# Patient Record
Sex: Female | Born: 1990 | Hispanic: Yes | Smoking: Never smoker
Health system: Southern US, Community
[De-identification: ages and names within clinical notes are randomized; demographics above are authoritative.]

## PROBLEM LIST (undated history)

## (undated) ENCOUNTER — Inpatient Hospital Stay (HOSPITAL_COMMUNITY): Payer: Self-pay

## (undated) DIAGNOSIS — E8881 Metabolic syndrome: Secondary | ICD-10-CM

## (undated) DIAGNOSIS — Z8679 Personal history of other diseases of the circulatory system: Secondary | ICD-10-CM

## (undated) DIAGNOSIS — E88819 Insulin resistance, unspecified: Secondary | ICD-10-CM

## (undated) DIAGNOSIS — Z8709 Personal history of other diseases of the respiratory system: Secondary | ICD-10-CM

## (undated) DIAGNOSIS — D62 Acute posthemorrhagic anemia: Secondary | ICD-10-CM

## (undated) HISTORY — PX: NO PAST SURGERIES: SHX2092

---

## 2012-08-22 ENCOUNTER — Encounter (HOSPITAL_COMMUNITY): Payer: Self-pay | Admitting: *Deleted

## 2012-08-22 ENCOUNTER — Emergency Department (HOSPITAL_COMMUNITY)
Admission: EM | Admit: 2012-08-22 | Discharge: 2012-08-23 | Disposition: A | Discharge status: Home / Self Care | Payer: No Typology Code available for payment source | Attending: Emergency Medicine | Admitting: Emergency Medicine

## 2012-08-22 DIAGNOSIS — O200 Threatened abortion: Secondary | ICD-10-CM

## 2012-08-22 DIAGNOSIS — Z79899 Other long term (current) drug therapy: Secondary | ICD-10-CM | POA: Insufficient documentation

## 2012-08-22 DIAGNOSIS — O9989 Other specified diseases and conditions complicating pregnancy, childbirth and the puerperium: Secondary | ICD-10-CM | POA: Insufficient documentation

## 2012-08-22 DIAGNOSIS — R110 Nausea: Secondary | ICD-10-CM | POA: Insufficient documentation

## 2012-08-22 DIAGNOSIS — E860 Dehydration: Secondary | ICD-10-CM

## 2012-08-22 HISTORY — DX: Metabolic syndrome: E88.81

## 2012-08-22 HISTORY — DX: Insulin resistance, unspecified: E88.819

## 2012-08-22 LAB — CBC
HCT: 37.9 % (ref 36.0–46.0)
RDW: 13.5 % (ref 11.5–15.5)
WBC: 10.1 10*3/uL (ref 4.0–10.5)

## 2012-08-22 LAB — TYPE AND SCREEN
ABO/RH(D): O POS
Antibody Screen: NEGATIVE

## 2012-08-22 LAB — URINALYSIS, ROUTINE W REFLEX MICROSCOPIC
Glucose, UA: NEGATIVE mg/dL
Specific Gravity, Urine: 1.024 (ref 1.005–1.030)
Urobilinogen, UA: 0.2 mg/dL (ref 0.0–1.0)

## 2012-08-22 LAB — URINE MICROSCOPIC-ADD ON

## 2012-08-22 LAB — HCG, QUANTITATIVE, PREGNANCY: hCG, Beta Chain, Quant, S: 52710 m[IU]/mL — ABNORMAL HIGH (ref <5)

## 2012-08-22 MED ORDER — SODIUM CHLORIDE 0.9 % IV BOLUS (SEPSIS): 1000.0000 mL | Freq: Once | INTRAVENOUS | Status: DC

## 2012-08-22 NOTE — ED Notes (Signed)
Pt reports being [redacted] weeks pregnant-said that her OBGYN was 2 weeks ago, states that everything looked good.  Pt reports that she has had some abdominal cramping and slight bleeding today.  States that she has not had to use a pad or tampon, reports that she only notices blood after urinating.  No distress noted.

## 2012-08-23 LAB — OB RESULTS CONSOLE ABO/RH: RH Type: POSITIVE

## 2012-08-23 NOTE — ED Provider Notes (Signed)
History     CSN: [id number]  Arrival date & time 08/22/12  2109   First MD Initiated Contact with Patient 08/22/12 2337      Chief Complaint  Patient presents with  . Vaginal Bleeding  . Possible Pregnancy    (Consider location/radiation/quality/duration/timing/severity/associated sxs/prior treatment) HPI  22 yo G1 P0 female with known IUP, LMP 07/18/12, presents with spotty vaginal bleeding x approx 4 hrs. Denies abdominal pain, unusual vaginal discharge and dysuria. No previous VB during this pregnancy. Patient notes that she has been nauseated but, denies vomiting. Feels her po intake may be mildly diminished secondary to nausea.   Pt has not passed clots or tissue.    Past Medical History  Diagnosis Date  . Insulin resistance     History reviewed. No pertinent past surgical history.  History reviewed. No pertinent family history.  History  Substance Use Topics  . Smoking status: Never Smoker   . Smokeless tobacco: Not on file  . Alcohol Use: No    OB History   Grav Para Term Preterm Abortions TAB SAB Ect Mult Living   1               Review of Systems  Gen: no weight loss, fevers, chills, night sweats Eyes: no discharge or drainage, no occular pain or visual changes Nose: no epistaxis or rhinorrhea Mouth: no dental pain, no sore throat Neck: no neck pain Lungs: no SOB, cough, wheezing CV: no chest pain, palpitations, dependent edema or orthopnea Abd: no abdominal pain, nausea, vomiting GU: As per history of present illness, otherwise MSK: no myalgias or arthralgias Neuro: no headache, no focal neurologic deficits Skin: no rash Psyche: negative.  Allergies  Ibuprofen  Home Medications   Current Outpatient Rx  Name  Route  Sig  Dispense  Refill  . metFORMIN (GLUCOPHAGE) 850 MG tablet   Oral   Take 850 mg by mouth 2 (two) times daily with a meal.         . Prenatal Vit-Fe Fumarate-FA (MULTIVITAMIN-PRENATAL) 27-0.8 MG TABS   Oral   Take 1  tablet by mouth daily at 12 noon.           BP 134/85  Pulse 85  Temp(Src) 98.7 F (37.1 C) (Oral)  Resp 18  SpO2 100%  Physical Exam Gen: well developed and well nourished appearing Head: NCAT Eyes: PERL, EOMI Nose: no epistaixis or rhinorrhea Mouth/throat: mucosa is moist and pink Neck: supple, no stridor Lungs: CTA B, no wheezing, rhonchi or rales Heart-regular rate and rhythm, no murmur, extremities well perfused Abd: soft, notender, nondistended Back: no ttp, no cva ttp Skin: no rashese, wnl Neuro: CN ii-xii grossly intact, no focal deficits Psyche; normal affect,  calm and cooperative.   ED Course  Procedures (including critical care time)  Results for orders placed during the hospital encounter of 08/22/12 (from the past 24 hour(s))  URINALYSIS, ROUTINE W REFLEX MICROSCOPIC     Status: Abnormal   Collection Time    08/22/12  9:39 PM      Result Value Range   Color, Urine YELLOW  YELLOW   APPearance CLEAR  CLEAR   Specific Gravity, Urine 1.024  1.005 - 1.030   pH 6.0  5.0 - 8.0   Glucose, UA NEGATIVE  NEGATIVE mg/dL   Hgb urine dipstick SMALL (*) NEGATIVE   Bilirubin Urine NEGATIVE  NEGATIVE   Ketones, ur >80 (*) NEGATIVE mg/dL   Protein, ur NEGATIVE  NEGATIVE mg/dL  Urobilinogen, UA 0.2  0.0 - 1.0 mg/dL   Nitrite NEGATIVE  NEGATIVE   Leukocytes, UA NEGATIVE  NEGATIVE  URINE MICROSCOPIC-ADD ON     Status: Abnormal   Collection Time    08/22/12  9:39 PM      Result Value Range   Squamous Epithelial / LPF FEW (*) RARE   WBC, UA 3-6  <3 WBC/hpf   RBC / HPF 3-6  <3 RBC/hpf   Bacteria, UA RARE  RARE   Urine-Other MUCOUS PRESENT    POCT PREGNANCY, URINE     Status: Abnormal   Collection Time    08/22/12  9:42 PM      Result Value Range   Preg Test, Ur POSITIVE (*) NEGATIVE  HCG, QUANTITATIVE, PREGNANCY     Status: Abnormal   Collection Time    08/22/12  9:49 PM      Result Value Range   hCG, Beta Chain, Quant, S 52710 (*) <5 mIU/mL  CBC     Status:  None   Collection Time    08/22/12  9:50 PM      Result Value Range   WBC 10.1  4.0 - 10.5 K/uL   RBC 4.45  3.87 - 5.11 MIL/uL   Hemoglobin 13.4  12.0 - 15.0 g/dL   HCT 16.1  09.6 - 04.5 %   MCV 85.2  78.0 - 100.0 fL   MCH 30.1  26.0 - 34.0 pg   MCHC 35.4  30.0 - 36.0 g/dL   RDW 40.9  81.1 - 91.4 %   Platelets 276  150 - 400 K/uL  ABO/RH     Status: None   Collection Time    08/22/12  9:55 PM      Result Value Range   ABO/RH(D) O POS    TYPE AND SCREEN     Status: None   Collection Time    08/22/12  9:55 PM      Result Value Range   ABO/RH(D) O POS     Antibody Screen NEG     Sample Expiration 08/25/2012     Bedside U/S shows singleton IUP with visible heart beat.    MDM  Patient with known IUP here with sx consistent with threatened abortion. Bedside U/S shows fetal cardiac activity. I have relayed this information to the patient and explained diagnosis of threatened miscarriage along with indication for pelvic rest. Pt is noted to have some mild ketonuria.  Will tx with IVF. Patient to f/u with obstetrician.         Brandt Loosen, MD 08/23/12 [phone]

## 2012-08-29 LAB — OB RESULTS CONSOLE RUBELLA ANTIBODY, IGM: RUBELLA: IMMUNE

## 2012-08-29 LAB — OB RESULTS CONSOLE HIV ANTIBODY (ROUTINE TESTING): HIV: NONREACTIVE

## 2012-08-29 LAB — OB RESULTS CONSOLE HEPATITIS B SURFACE ANTIGEN: HEP B S AG: NEGATIVE

## 2012-08-29 LAB — OB RESULTS CONSOLE RPR: RPR: NONREACTIVE

## 2012-09-11 LAB — OB RESULTS CONSOLE GC/CHLAMYDIA
Chlamydia: NEGATIVE
Gonorrhea: NEGATIVE

## 2013-01-18 ENCOUNTER — Encounter (HOSPITAL_COMMUNITY): Payer: Self-pay | Admitting: *Deleted

## 2013-01-18 ENCOUNTER — Inpatient Hospital Stay (HOSPITAL_COMMUNITY)
Admission: AD | Admit: 2013-01-18 | Discharge: 2013-01-18 | Disposition: A | Discharge status: Home / Self Care | Payer: BC Managed Care – PPO | Source: Ambulatory Visit | Attending: Obstetrics | Admitting: Obstetrics

## 2013-01-18 DIAGNOSIS — R109 Unspecified abdominal pain: Secondary | ICD-10-CM | POA: Insufficient documentation

## 2013-01-18 DIAGNOSIS — O99891 Other specified diseases and conditions complicating pregnancy: Secondary | ICD-10-CM | POA: Insufficient documentation

## 2013-01-18 LAB — URINALYSIS, ROUTINE W REFLEX MICROSCOPIC
Bilirubin Urine: NEGATIVE
Leukocytes, UA: NEGATIVE
Nitrite: NEGATIVE
Specific Gravity, Urine: <1.005 — ABNORMAL LOW (ref 1.005–1.030)
pH: 6 (ref 5.0–8.0)

## 2013-01-18 NOTE — H&P (Signed)
CC: abdominal/ back pain  HPI: 22 yo G1 at 28'1 presents w/ 4 hrs of back./ abdominal pain. Patient notes pain or awoke her from sleep and, as and goes. Good fetal movement. No leakage of fluid. No vaginal bleeding. Patient notes no fevers, no nausea or vomiting. Patient did have a bowel movement this morning. Patient reports no constipation. Patient reports no new activities or musculoskeletal trauma.  Past medical history: Polycystic ovarian syndrome, anxiety   PE: Filed Vitals:   01/18/13 0721 01/18/13 0724  BP: 126/76   Pulse: 92   Temp: 97.8 F (36.6 C)   TempSrc: Oral   Resp: 18   Height:  5\' 4"  (1.626 m)  Weight:  77.111 kg (170 lb)   general: alert, no distress Cardiovascular: Regular rate and rhythm no murmurs rubs Pulmonary: Clear to auscultation bilaterally Back: No costovertebral angle tenderness, no apparent musculoskeletal spasm Abdomen: No right upper quadrant pain, fundus nontender, abdomen nontender nondistended, no rebound, no guarding, gravid Lower extremity: No edema, nontender GU: Normal external genitalia, normal vagina, cervix long closed, no bladder tenderness, no lower uterine tenderness, no adnexal masses or tenderness, no abnormal discharge, no evidence rupture of membranes, no bleeding  Toco: No contractions Fetal heart beat: 145, positive accelerations, no decelerations, 10 beat variability  Wet prep: No sperm, no trichomonas, no white blood cells, normal lactobacilli, normal epithelial cells, no clue cells  Assessment and plan: 22 year old G1 at 28 weeks and 1 day presents with nonspecific abdominal back pain with negative evaluation. No evidence of preterm labor as no contractions and cervix is closed. FFN pending No evidence of the urinary tract infection by a negative urinalysis, will send urine culture Likely back pain in pregnancy-  will recommend massage, heat, Tylenol as needed, exercise, avoidance of constipation. Patient to return if pain  worsens or she develops fevers. Continue daily kick counts. Fetal testing reactive and reassuring  Larrie Lucia A. 01/18/2013 9:11 AM

## 2013-01-18 NOTE — MAU Note (Signed)
Pt states she was woken up at 0400 this morning with this pain.  States it feels like menstrual cramps.  Says her back is killing her.  Denies bleeding or abnormal vaginal discharge.

## 2013-01-19 LAB — URINE CULTURE: Culture: NO GROWTH

## 2013-03-02 ENCOUNTER — Inpatient Hospital Stay (HOSPITAL_COMMUNITY)
Admission: AD | Admit: 2013-03-02 | Discharge: 2013-03-02 | Disposition: A | Discharge status: Home / Self Care | Payer: BC Managed Care – PPO | Source: Ambulatory Visit | Attending: Obstetrics and Gynecology | Admitting: Obstetrics and Gynecology

## 2013-03-02 ENCOUNTER — Inpatient Hospital Stay (HOSPITAL_COMMUNITY): Payer: BC Managed Care – PPO

## 2013-03-02 ENCOUNTER — Encounter (HOSPITAL_COMMUNITY): Payer: Self-pay

## 2013-03-02 DIAGNOSIS — O4703 False labor before 37 completed weeks of gestation, third trimester: Secondary | ICD-10-CM

## 2013-03-02 DIAGNOSIS — M545 Low back pain, unspecified: Secondary | ICD-10-CM | POA: Insufficient documentation

## 2013-03-02 DIAGNOSIS — R109 Unspecified abdominal pain: Secondary | ICD-10-CM | POA: Insufficient documentation

## 2013-03-02 DIAGNOSIS — O26859 Spotting complicating pregnancy, unspecified trimester: Secondary | ICD-10-CM | POA: Insufficient documentation

## 2013-03-02 LAB — URINALYSIS, ROUTINE W REFLEX MICROSCOPIC
Bilirubin Urine: NEGATIVE
Glucose, UA: NEGATIVE mg/dL
Hgb urine dipstick: NEGATIVE
Ketones, ur: NEGATIVE mg/dL
pH: 6 (ref 5.0–8.0)

## 2013-03-02 LAB — URINE MICROSCOPIC-ADD ON

## 2013-03-02 LAB — WET PREP, GENITAL: Clue Cells Wet Prep HPF POC: NONE SEEN

## 2013-03-02 MED ORDER — NIFEDIPINE 10 MG PO CAPS: 10.0000 mg | ORAL_CAPSULE | Freq: Three times a day (TID) | ORAL | Status: DC

## 2013-03-02 NOTE — MAU Note (Signed)
Pt states here for spotting that was bright red this am, noted when wiping, less noted last time she checked. Was having back pain and a pulling sensation intermittently. Pain was worse last night.

## 2013-03-02 NOTE — MAU Provider Note (Signed)
  History     CSN: [id number]  Arrival date and time: 03/02/13 1144 Provider on unit: 1200 Provider notified: 1220 Provider at bedside: 1230     Chief Complaint  Patient presents with  . Abdominal Cramping   HPI  Ms. Brandy Bradley is a 21 yo G1P0 at 34.[redacted]wks gestation by ultrasound.  She presents today with complaints of spotting with wiping x 1 episode this AM, abdominal pressure and low back pain since last night. She reports being at risk for PTL, on bedrest and taking Prometrium.  She also complains of a mucousy vaginal   discharge with the spotting since this AM.    Past Medical History  Diagnosis Date  . Insulin resistance     History reviewed. No pertinent past surgical history.  History reviewed. No pertinent family history.  History  Substance Use Topics  . Smoking status: Never Smoker   . Smokeless tobacco: Not on file  . Alcohol Use: No    Allergies:  Allergies  Allergen Reactions  . Ibuprofen Other (See Comments)    Eye Swelling     Prescriptions prior to admission  Medication Sig Dispense Refill  . polyethylene glycol (MIRALAX / GLYCOLAX) packet Take 17 g by mouth daily as needed for mild constipation.      . Prenatal Vit-Fe Fumarate-FA (PRENATAL MULTIVITAMIN) TABS tablet Take 1 tablet by mouth daily at 12 noon.      . Ranitidine HCl (ZANTAC PO) Take 1 tablet by mouth 2 (two) times daily.        Review of Systems  Constitutional: Negative.   HENT: Negative.   Eyes: Negative.   Respiratory: Negative.   Cardiovascular: Negative.   Gastrointestinal: Positive for abdominal pain.       Pelvic cramping  Genitourinary:       Vaginal spotting  Musculoskeletal: Negative.   Skin: Negative.   Neurological: Negative.   Endo/Heme/Allergies: Negative.   Psychiatric/Behavioral: Negative.     Physical Exam   Blood pressure 141/78, pulse 101, temperature 98.2 F (36.8 C), temperature source Oral, resp. rate 18, height 5\' 4"  (1.626 m),  weight 80.4 kg (177 lb 4 oz), last menstrual period 07/05/2012, SpO2 100.00%.  Physical Exam  Constitutional: She is oriented to person, place, and time. She appears well-developed and well-nourished.  HENT:  Head: Normocephalic and atraumatic.  Eyes: Pupils are equal, round, and reactive to light.  Neck: Normal range of motion. Neck supple.  Cardiovascular: Normal rate, regular rhythm and normal heart sounds.   Respiratory: Effort normal and breath sounds normal.  GI: Soft. Bowel sounds are normal.  Genitourinary: Vagina normal and uterus normal.  gravid  Musculoskeletal: Normal range of motion.  Neurological: She is alert and oriented to person, place, and time.  Skin: Skin is warm and dry.  Psychiatric: She has a normal mood and affect. Her behavior is normal. Judgment and thought content normal.  VE: 1cm/50%/high/firm  MAU Course  Procedures CCUA Wet Prep fFN EFM OB limited ultrasound Assessment and Plan  IUP at 34.[redacted]wks gestation Spotting Back Pain  Discharge home  Preterm Labor Precautions Procardia 10 mg po TID x 48 hrs Keep scheduled appointment on 03/07/13 Call if symptoms worsen  *Dr. Juliene Pina notified of plan - agrees - recommends Procardia 10mg  TID x 48hrs  Kenard Gower, MSN, CNM 03/02/2013, 12:38 PM

## 2013-03-03 LAB — URINE CULTURE: Colony Count: NO GROWTH

## 2013-03-03 NOTE — MAU Provider Note (Signed)
Reviewed and agree with note and plan. V.Mashanda Ishibashi, MD  

## 2013-03-06 NOTE — L&D Delivery Note (Signed)
Delivery Note At 9:50 AM a viable and healthy female was delivered via Vaginal, Spontaneous Delivery (Presentation:OA ).  APGAR: 9 , 9; weight - pending Placenta status: complete, spontaneous .  Cord: 3 vessels with the following complications: None.  Cord pH: Not indicated  Anesthesia: Epidural Local 1% Lidocaine Episiotomy: None Lacerations: Vaginal; to right Labial Suture Repair: 3.0 Vicryl rapide Est. Blood Loss (mL): 400 cc  Mom to postpartum.  Baby to Couplet care / Skin to Skin.  Jenne Sellinger R 04/01/2013, 10:24 AM

## 2013-03-07 LAB — OB RESULTS CONSOLE GBS: STREP GROUP B AG: NEGATIVE

## 2013-03-29 ENCOUNTER — Encounter (HOSPITAL_COMMUNITY): Payer: Self-pay | Admitting: *Deleted

## 2013-03-29 ENCOUNTER — Inpatient Hospital Stay (HOSPITAL_COMMUNITY)
Admission: AD | Admit: 2013-03-29 | Discharge: 2013-03-29 | Disposition: A | Discharge status: Home / Self Care | Payer: BC Managed Care – PPO | Source: Ambulatory Visit | Attending: Obstetrics | Admitting: Obstetrics

## 2013-03-29 DIAGNOSIS — O479 False labor, unspecified: Secondary | ICD-10-CM | POA: Insufficient documentation

## 2013-03-29 NOTE — Progress Notes (Signed)
Wiliam Ke Bailey CNM notified of pt's complaints of contractions, and LOF, VE results and orders received

## 2013-03-29 NOTE — MAU Note (Signed)
Pt presents with complaints of contractions that started early this morning but have gotten worse since 9am. States some leakage of fluid this morning, denies any bleeding

## 2013-03-29 NOTE — Progress Notes (Signed)
Notified pt here for labor eval. No cervical exam performed yet, RN to check pt then call back with results.

## 2013-03-29 NOTE — Discharge Instructions (Signed)

## 2013-04-01 ENCOUNTER — Inpatient Hospital Stay (HOSPITAL_COMMUNITY)
Admission: AD | Admit: 2013-04-01 | Discharge: 2013-04-03 | DRG: 775 | Disposition: A | Discharge status: Home / Self Care | Payer: No Typology Code available for payment source | Source: Ambulatory Visit | Attending: Obstetrics & Gynecology | Admitting: Obstetrics & Gynecology

## 2013-04-01 ENCOUNTER — Inpatient Hospital Stay (HOSPITAL_COMMUNITY): Payer: No Typology Code available for payment source | Admitting: Anesthesiology

## 2013-04-01 ENCOUNTER — Encounter (HOSPITAL_COMMUNITY): Payer: No Typology Code available for payment source | Admitting: Anesthesiology

## 2013-04-01 ENCOUNTER — Encounter (HOSPITAL_COMMUNITY): Payer: Self-pay | Admitting: *Deleted

## 2013-04-01 DIAGNOSIS — D62 Acute posthemorrhagic anemia: Secondary | ICD-10-CM

## 2013-04-01 DIAGNOSIS — O99814 Abnormal glucose complicating childbirth: Secondary | ICD-10-CM | POA: Diagnosis present

## 2013-04-01 DIAGNOSIS — IMO0001 Reserved for inherently not codable concepts without codable children: Secondary | ICD-10-CM

## 2013-04-01 DIAGNOSIS — E282 Polycystic ovarian syndrome: Secondary | ICD-10-CM | POA: Diagnosis present

## 2013-04-01 DIAGNOSIS — O26879 Cervical shortening, unspecified trimester: Secondary | ICD-10-CM | POA: Diagnosis present

## 2013-04-01 DIAGNOSIS — Z794 Long term (current) use of insulin: Secondary | ICD-10-CM

## 2013-04-01 DIAGNOSIS — O34599 Maternal care for other abnormalities of gravid uterus, unspecified trimester: Secondary | ICD-10-CM | POA: Diagnosis present

## 2013-04-01 DIAGNOSIS — O9903 Anemia complicating the puerperium: Secondary | ICD-10-CM | POA: Diagnosis not present

## 2013-04-01 HISTORY — DX: Acute posthemorrhagic anemia: D62

## 2013-04-01 LAB — CBC
HEMATOCRIT: 34.8 % — AB (ref 36.0–46.0)
HEMOGLOBIN: 11.3 g/dL — AB (ref 12.0–15.0)
MCH: 25.1 pg — AB (ref 26.0–34.0)
MCHC: 32.5 g/dL (ref 30.0–36.0)
MCV: 77.3 fL — ABNORMAL LOW (ref 78.0–100.0)
Platelets: 207 10*3/uL (ref 150–400)
RBC: 4.5 MIL/uL (ref 3.87–5.11)
RDW: 15.1 % (ref 11.5–15.5)
WBC: 8.2 10*3/uL (ref 4.0–10.5)

## 2013-04-01 LAB — ABO/RH: ABO/RH(D): O POS

## 2013-04-01 LAB — TYPE AND SCREEN
ABO/RH(D): O POS
ANTIBODY SCREEN: NEGATIVE

## 2013-04-01 LAB — RPR: RPR Ser Ql: NONREACTIVE

## 2013-04-01 MED ORDER — PANTOPRAZOLE SODIUM 40 MG PO TBEC
40.0000 mg | DELAYED_RELEASE_TABLET | Freq: Every day | ORAL | Status: DC
  Administered 2013-04-01 – 2013-04-02 (×2): 40 mg via ORAL
  Filled 2013-04-01 (×2): qty 1

## 2013-04-01 MED ORDER — LACTATED RINGERS IV SOLN
500.0000 mL | INTRAVENOUS | Status: DC | PRN
Start: 2013-04-01 — End: 2013-04-01

## 2013-04-01 MED ORDER — IBUPROFEN 600 MG PO TABS: 600.0000 mg | ORAL_TABLET | Freq: Four times a day (QID) | ORAL | Status: DC | PRN

## 2013-04-01 MED ORDER — OXYCODONE-ACETAMINOPHEN 5-325 MG PO TABS: 1.0000 | ORAL_TABLET | ORAL | Status: DC | PRN

## 2013-04-01 MED ORDER — SENNOSIDES-DOCUSATE SODIUM 8.6-50 MG PO TABS
2.0000 | ORAL_TABLET | ORAL | Status: DC
  Filled 2013-04-01 (×2): qty 2

## 2013-04-01 MED ORDER — TETANUS-DIPHTH-ACELL PERTUSSIS 5-2.5-18.5 LF-MCG/0.5 IM SUSP: 0.5000 mL | Freq: Once | INTRAMUSCULAR | Status: DC

## 2013-04-01 MED ORDER — ZOLPIDEM TARTRATE 5 MG PO TABS: 5.0000 mg | ORAL_TABLET | Freq: Every evening | ORAL | Status: DC | PRN

## 2013-04-01 MED ORDER — EPHEDRINE 5 MG/ML INJ
10.0000 mg | INTRAVENOUS | Status: DC | PRN
  Filled 2013-04-01: qty 4

## 2013-04-01 MED ORDER — EPHEDRINE 5 MG/ML INJ: 10.0000 mg | INTRAVENOUS | Status: DC | PRN

## 2013-04-01 MED ORDER — OXYTOCIN 40 UNITS IN LACTATED RINGERS INFUSION - SIMPLE MED
62.5000 mL/h | INTRAVENOUS | Status: DC
  Administered 2013-04-01: 62.5 mL/h via INTRAVENOUS
  Filled 2013-04-01: qty 1000

## 2013-04-01 MED ORDER — LIDOCAINE HCL (PF) 1 % IJ SOLN
30.0000 mL | INTRAMUSCULAR | Status: AC | PRN
  Administered 2013-04-01: 30 mL via SUBCUTANEOUS
  Filled 2013-04-01 (×2): qty 30

## 2013-04-01 MED ORDER — DIPHENHYDRAMINE HCL 50 MG/ML IJ SOLN
12.5000 mg | INTRAMUSCULAR | Status: DC | PRN
  Administered 2013-04-01 (×2): 12.5 mg via INTRAVENOUS
  Filled 2013-04-01 (×2): qty 1

## 2013-04-01 MED ORDER — BUTORPHANOL TARTRATE 1 MG/ML IJ SOLN
1.0000 mg | INTRAMUSCULAR | Status: DC
  Administered 2013-04-01: 1 mg via INTRAVENOUS
  Filled 2013-04-01: qty 1

## 2013-04-01 MED ORDER — ONDANSETRON HCL 4 MG PO TABS
4.0000 mg | ORAL_TABLET | ORAL | Status: DC | PRN
Start: 2013-04-01 — End: 2013-04-03

## 2013-04-01 MED ORDER — PHENYLEPHRINE 40 MCG/ML (10ML) SYRINGE FOR IV PUSH (FOR BLOOD PRESSURE SUPPORT)
80.0000 ug | PREFILLED_SYRINGE | INTRAVENOUS | Status: DC | PRN
  Filled 2013-04-01: qty 10

## 2013-04-01 MED ORDER — FLEET ENEMA 7-19 GM/118ML RE ENEM: 1.0000 | ENEMA | RECTAL | Status: DC | PRN

## 2013-04-01 MED ORDER — ACETAMINOPHEN 325 MG PO TABS: 650.0000 mg | ORAL_TABLET | ORAL | Status: DC | PRN

## 2013-04-01 MED ORDER — OXYCODONE-ACETAMINOPHEN 5-325 MG PO TABS
1.0000 | ORAL_TABLET | ORAL | Status: DC | PRN
  Administered 2013-04-01: 1 via ORAL
  Administered 2013-04-01 – 2013-04-02 (×2): 2 via ORAL
  Administered 2013-04-02: 1 via ORAL
  Administered 2013-04-02: 2 via ORAL
  Filled 2013-04-01: qty 2
  Filled 2013-04-01: qty 1
  Filled 2013-04-01 (×2): qty 2
  Filled 2013-04-01: qty 1

## 2013-04-01 MED ORDER — OXYTOCIN BOLUS FROM INFUSION: 500.0000 mL | INTRAVENOUS | Status: DC

## 2013-04-01 MED ORDER — FENTANYL 2.5 MCG/ML BUPIVACAINE 1/10 % EPIDURAL INFUSION (WH - ANES)
14.0000 mL/h | INTRAMUSCULAR | Status: DC | PRN
  Filled 2013-04-01: qty 125

## 2013-04-01 MED ORDER — CITRIC ACID-SODIUM CITRATE 334-500 MG/5ML PO SOLN: 30.0000 mL | ORAL | Status: DC | PRN

## 2013-04-01 MED ORDER — FENTANYL 2.5 MCG/ML BUPIVACAINE 1/10 % EPIDURAL INFUSION (WH - ANES)
INTRAMUSCULAR | Status: DC | PRN
  Administered 2013-04-01: 14 mL/h via EPIDURAL

## 2013-04-01 MED ORDER — WITCH HAZEL-GLYCERIN EX PADS: 1.0000 "application " | MEDICATED_PAD | CUTANEOUS | Status: DC | PRN

## 2013-04-01 MED ORDER — LACTATED RINGERS IV SOLN
INTRAVENOUS | Status: DC
  Administered 2013-04-01: 03:00:00 via INTRAVENOUS

## 2013-04-01 MED ORDER — DIBUCAINE 1 % RE OINT
1.0000 "application " | TOPICAL_OINTMENT | RECTAL | Status: DC | PRN
  Filled 2013-04-01: qty 28

## 2013-04-01 MED ORDER — ONDANSETRON HCL 4 MG/2ML IJ SOLN: 4.0000 mg | INTRAMUSCULAR | Status: DC | PRN

## 2013-04-01 MED ORDER — SIMETHICONE 80 MG PO CHEW: 80.0000 mg | CHEWABLE_TABLET | ORAL | Status: DC | PRN

## 2013-04-01 MED ORDER — LANOLIN HYDROUS EX OINT: TOPICAL_OINTMENT | CUTANEOUS | Status: DC | PRN

## 2013-04-01 MED ORDER — PHENYLEPHRINE 40 MCG/ML (10ML) SYRINGE FOR IV PUSH (FOR BLOOD PRESSURE SUPPORT): 80.0000 ug | PREFILLED_SYRINGE | INTRAVENOUS | Status: DC | PRN

## 2013-04-01 MED ORDER — ONDANSETRON HCL 4 MG/2ML IJ SOLN: 4.0000 mg | Freq: Four times a day (QID) | INTRAMUSCULAR | Status: DC | PRN

## 2013-04-01 MED ORDER — LACTATED RINGERS IV SOLN
500.0000 mL | Freq: Once | INTRAVENOUS | Status: AC
  Administered 2013-04-01: 500 mL via INTRAVENOUS

## 2013-04-01 MED ORDER — BENZOCAINE-MENTHOL 20-0.5 % EX AERO
1.0000 "application " | INHALATION_SPRAY | CUTANEOUS | Status: DC | PRN
  Filled 2013-04-01 (×3): qty 56

## 2013-04-01 MED ORDER — PRENATAL MULTIVITAMIN CH
1.0000 | ORAL_TABLET | Freq: Every day | ORAL | Status: DC
  Administered 2013-04-02: 1 via ORAL
  Filled 2013-04-01: qty 1

## 2013-04-01 MED ORDER — DIPHENHYDRAMINE HCL 25 MG PO CAPS
25.0000 mg | ORAL_CAPSULE | Freq: Four times a day (QID) | ORAL | Status: DC | PRN
  Administered 2013-04-02: 25 mg via ORAL
  Filled 2013-04-01: qty 1

## 2013-04-01 MED ORDER — LIDOCAINE HCL (PF) 1 % IJ SOLN
INTRAMUSCULAR | Status: DC | PRN
  Administered 2013-04-01 (×2): 9 mL

## 2013-04-01 MED ORDER — ACETAMINOPHEN 325 MG PO TABS
650.0000 mg | ORAL_TABLET | Freq: Four times a day (QID) | ORAL | Status: DC | PRN
  Administered 2013-04-02: 650 mg via ORAL
  Filled 2013-04-01 (×2): qty 2

## 2013-04-01 NOTE — Progress Notes (Signed)
Pt up to BR with steady. Pt became light-headed and dizzy. Pt back to bed and informed to not get up without help.

## 2013-04-01 NOTE — Anesthesia Procedure Notes (Signed)
Epidural Patient location during procedure: OB Start time: 04/01/2013 5:27 AM End time: 04/01/2013 5:31 AM  Staffing Anesthesiologist: Leilani AbleHATCHETT, Jaydence Vanyo Performed by: anesthesiologist   Preanesthetic Checklist Completed: patient identified, surgical consent, pre-op evaluation, timeout performed, IV checked, risks and benefits discussed and monitors and equipment checked  Epidural Patient position: sitting Prep: site prepped and draped and DuraPrep Patient monitoring: continuous pulse ox and blood pressure Approach: midline Injection technique: LOR air  Needle:  Needle type: Tuohy  Needle gauge: 17 G Needle length: 9 cm and 9 Needle insertion depth: 5 cm cm Catheter type: closed end flexible Catheter size: 19 Gauge Catheter at skin depth: 10 cm Test dose: negative and Other  Assessment Sensory level: T9 Events: blood not aspirated, injection not painful, no injection resistance, negative IV test and no paresthesia  Additional Notes Reason for block:procedure for pain

## 2013-04-01 NOTE — Anesthesia Postprocedure Evaluation (Signed)
Anesthesia Post Note  Patient: Brandy Bradley  Procedure(s) Performed: * No procedures listed *  Anesthesia type: Epidural  Patient location: Mother/Baby  Post pain: Pain level controlled  Post assessment: Post-op Vital signs reviewed  Last Vitals:  Filed Vitals:   04/01/13 1619  BP: 124/79  Pulse: 94  Temp: 36.4 C  Resp: 20    Post vital signs: Reviewed  Level of consciousness:alert  Complications: No apparent anesthesia complications

## 2013-04-01 NOTE — MAU Note (Signed)
PT SAYS SHE STARTED HURTING BAD AT 0100.   Thursday IN OFFICE  2-3 CM .   DENIES HSV AND MRSA.

## 2013-04-01 NOTE — Progress Notes (Signed)
Brandy Bradley is a 23 y.o. G1P0 at 4867w4d admitted in active labor, progressing spontaneously. Feels pelvic pressure  Objective: BP 116/74  Pulse 84  Temp(Src) 98.2 F (36.8 C) (Oral)  Resp 20  Ht 5\' 4"  (1.626 m)  Wt 185 lb (83.915 kg)  BMI 31.74 kg/m2  SpO2 98%  LMP 07/05/2012 I/O last 3 completed shifts: In: -  Out: 50 [Urine:50]    FHT:  FHR: 150 bpm, variability: moderate,  accelerations:  Present,  decelerations:  Absent UC:   regular, every 3 minutes SVE:   Dilation: 10 Effacement (%): 100 Station: +1 Exam by:: Dr. Juliene Bradley OP position, will turn in left exaggerated Sims to assist rotation.   Labs: Lab Results  Component Value Date   WBC 8.2 04/01/2013   HGB 11.3* 04/01/2013   HCT 34.8* 04/01/2013   MCV 77.3* 04/01/2013   PLT 207 04/01/2013    Assessment / Plan: Spontaneous labor, progressing normally. ?AROM since 1/23 5 pm, no foul smell/ fever/meconium or maternal or fetal tachycardia noted.   Labor: Progressing normally Preeclampsia:  N/A Fetal Wellbeing:  Category I Pain Control:  Epidural I/D:  GBS(-) Anticipated MOD:  NSVD, needs rotation and descent  Brandy Bradley R 04/01/2013, 9:02 AM

## 2013-04-01 NOTE — Anesthesia Preprocedure Evaluation (Signed)
Anesthesia Evaluation  Patient identified by MRN, date of birth, ID band Patient awake    Reviewed: Allergy & Precautions, H&P , NPO status , Patient's Chart, lab work & pertinent test results  Airway Mallampati: II TM Distance: >3 FB Neck ROM: full    Dental no notable dental hx.    Pulmonary neg pulmonary ROS,    Pulmonary exam normal       Cardiovascular negative cardio ROS      Neuro/Psych negative neurological ROS  negative psych ROS   GI/Hepatic Neg liver ROS, GERD-  Medicated and Controlled,  Endo/Other  negative endocrine ROS  Renal/GU negative Renal ROS     Musculoskeletal   Abdominal Normal abdominal exam  (+)   Peds  Hematology negative hematology ROS (+)   Anesthesia Other Findings   Reproductive/Obstetrics (+) Pregnancy                           Anesthesia Physical Anesthesia Plan  ASA: II  Anesthesia Plan: Epidural   Post-op Pain Management:    Induction:   Airway Management Planned:   Additional Equipment:   Intra-op Plan:   Post-operative Plan:   Informed Consent: I have reviewed the patients History and Physical, chart, labs and discussed the procedure including the risks, benefits and alternatives for the proposed anesthesia with the patient or authorized representative who has indicated his/her understanding and acceptance.     Plan Discussed with:   Anesthesia Plan Comments:         Anesthesia Quick Evaluation

## 2013-04-01 NOTE — H&P (Signed)
Brandy Bradley is a 23 y.o. female presenting at 38.4 wks in active labor. ? ROM time, possible on 03/28/13 at 5 pm since leaking fluid since then, though MAU evaluation on 1/23 night was negative for ROM and labor. Pt continued to have some leaking of fluid since then and membranes not felt at admission. NO fluid noted on vaginal exam at admission but no foul odor or fever or meconium or maternal/fetal tachycardia.  Uncomplicated PNCare- primary Dr Ernestina PennaFogleman. Insulin resistance and PCOS, was on Metformin 2 mo before pregnancy and upto 11 wks in pregnancy. Labs, glucola sono normal. Cervical shortening at 32 wks, so received BTMZ but no tocolytics.   History OB History   Grav Para Term Preterm Abortions TAB SAB Ect Mult Living   1              Past Medical History  Diagnosis Date  . Insulin resistance    Past Surgical History  Procedure Laterality Date  . No past surgeries     Family History: family history includes Diabetes in her maternal grandmother. Social History:  reports that she has never smoked. She has never used smokeless tobacco. She reports that she does not drink alcohol or use illicit drugs.   Prenatal Transfer Tool  Maternal Diabetes: No, PCOS, took Metformin in 1st trim, stopped at 11 wks.  Genetic Screening: declined Down's screen. AFP1 negative Maternal Ultrasounds/Referrals: Normal Fetal Ultrasounds or other Referrals:  None Maternal Substance Abuse:  No Significant Maternal Medications:  None Significant Maternal Lab Results:  Lab values include: Group B Strep negative Other Comments:  Unsure time of AROM.  Betamethasone at 32 wks for short cervix  ROS neg  Exam Physical Exam  VS- normal.  A&O x 3, no acute distress. Pleasant HEENT neg, no thyromegaly Lungs CTA bilat CV RRR, S1S2 normal Abdo soft, non tender, non acute Extr no edema/ tenderness Pelvic 9 per RN /100%/-1/ VTX (progressed spontaneously from 4.5 cm at MAU exam) FHT  150s, category  I Toco q 3 min  Prenatal labs: ABO, Rh: --/--/O POS, O POS (01/27 0310) Antibody: NEG (01/27 0310) Rubella: Immune (06/26 0000) RPR: NON REACTIVE (01/27 0310)  HBsAg: Negative (06/26 0000)  HIV: Non-reactive (06/26 0000)  GBS: Negative (01/02 0000)  Glucola normal Anaomy sono female, normal.   Assessment/Plan: 23 yo, G1 at 38.4 wks, in active labor, FHT category I, anticipate SVD, EFW about 7 lbs ROutine admit orders,s/p epidural, GBS(-).   H/o Depression, remote, while on Mirena per pt, none in pregnancy. Watch for PPDepression

## 2013-04-02 LAB — CBC
HCT: 27.4 % — ABNORMAL LOW (ref 36.0–46.0)
Hemoglobin: 8.8 g/dL — ABNORMAL LOW (ref 12.0–15.0)
MCH: 24.9 pg — AB (ref 26.0–34.0)
MCHC: 32.1 g/dL (ref 30.0–36.0)
MCV: 77.6 fL — AB (ref 78.0–100.0)
Platelets: 220 10*3/uL (ref 150–400)
RBC: 3.53 MIL/uL — AB (ref 3.87–5.11)
RDW: 15.5 % (ref 11.5–15.5)
WBC: 12.5 10*3/uL — ABNORMAL HIGH (ref 4.0–10.5)

## 2013-04-02 NOTE — Progress Notes (Signed)
Patient ID: Brandy Reasonerlejandra Ponce de Bradley, female   DOB: Aug 04, 1990, 23 y.o.   MRN: [id number] PPD # 1  Subjective: Pt reports feeling well, sore/ Pain controlled with ibuprofen Tolerating po/ Voiding without problems/ No n/v Bleeding is moderate Newborn info:  Information for the patient's newborn:  Brandy Bradley, Girl Brandy Brokerlejandra [[id number]][[id number]]  female Feeding: breast   Objective:  VS: Blood pressure 110/75, pulse 87, temperature 98.9 F (37.2 C), temperature source Oral, resp. rate 20.    Recent Labs  04/01/13 0310 04/02/13 0530  WBC 8.2 12.5*  HGB 11.3* 8.8*  HCT 34.8* 27.4*  PLT 207 220    Blood type: O POS Rubella: Immune    Physical Exam:  General:  alert, cooperative and no distress CV: Regular rate and rhythm Resp: clear Abdomen: soft, nontender, normal bowel sounds Uterine Fundus: firm, below umbilicus, nontender Perineum: mild edema Lochia: moderate Ext: Homans sign is negative, no sign of DVT and no edema, redness or tenderness in the calves or thighs   A/P: PPD # 1/ G1P1001/ S/P: SVD ABL Anemia; will add fe supplement day of d/c Doing well Continue routine post partum orders Anticipate D/C home in AM    Big Island Endoscopy CenterFISHER,Oma Alpert K, MSN, [facility]-Douglass HospitalWHNP 04/02/2013, 10:20 AM

## 2013-04-02 NOTE — Lactation Note (Signed)
This note was copied from the chart of Brandy Bradley. Lactation Consultation Note  Patient Name: Brandy Bradley NGEXB'MToday's Date: 04/02/2013 Reason for consult: Follow-up assessment Baby 30 hours old. Mom reports breastfeeding going well, baby cluster feeding. Mom states baby is latching deeply, sometimes will slip to tip of the nipple, but mom re-latches baby deeper. Reviewed basics. Enc cue-based feeding, reviewed supply and demand. Mom reports no pain during nursing. Discussed mom's need to pump after maternity leave. Enc exclusive BF for first 3 weeks, and pumping and offering a bottle a day afterwards. Enc mom to call out for assistance as needed. Assisted mom to hand express.   Maternal Data    Feeding Feeding Type: Breast Fed Length of feed: 20 min  LATCH Score/Interventions Latch:  (Mother eating dinner, didn't want to latch baby at this time. Baby has been cluster feeding. )  Audible Swallowing: Spontaneous and intermittent  Type of Nipple: Everted at rest and after stimulation  Comfort (Breast/Nipple): Soft / non-tender     Hold (Positioning): No assistance needed to correctly position infant at breast.  LATCH Score: 10  Lactation Tools Discussed/Used     Consult Status Consult Status: Follow-up Date: 04/03/13 Follow-up type: In-patient    Geralynn OchsWILLIARD, Aleksandr Pellow 04/02/2013, 4:48 PM

## 2013-04-02 NOTE — Progress Notes (Signed)
Patient was referred for history of depression/anxiety.  * Referral screened out by Clinical Social Worker because none of the following criteria appear to apply:  ~ History of anxiety/depression during this pregnancy, or of post-partum depression  ~ Diagnosis of anxiety and/or depression within last 3 years  ~ History of depression due to pregnancy loss/loss of child  OR  * Patient's symptoms currently being treated with medication and/or therapy.  Please contact the Clinical Social Worker if needs arise, or by the patient's request.  Pt experienced depressed feelings that she attributes to the birth control she was prescribed. Pts symptoms resolved once birth control was stopped. CSW discussed PP depression sign/symptoms with pt & provided her with a Feelings After Birth brochure, as a resource. FOB was at the bedside & supportive. CSW available to assist further if needed.

## 2013-04-02 NOTE — Discharge Summary (Signed)
Obstetric Discharge Summary Reason for Admission: Pt is a G1 P0 @ 38.4 wks in active labor. ? ROM time, possible on 03/28/13 at 5 pm since leaking fluid since then, though MAU evaluation on 1/23 night was negative for ROM and labor. Pt continued to have some leaking of fluid since then and membranes not felt at admission. NO fluid noted on vaginal exam at admission, and no foul odor, fever, meconium or maternal/fetal tachycardia.  Uncomplicated PNCare- primary Dr Brandy Bradley. Insulin resistance and PCOS, was on Metformin 2 mo before pregnancy and upto 11 wks in pregnancy. Labs, glucola sono normal. Cervical shortening at 32 wks, so received BTMZ but no tocolytics.   Prenatal Procedures: NST and ultrasound Intrapartum Procedures: spontaneous vaginal delivery Postpartum Procedures: none Complications-Operative and Postpartum: none Hemoglobin  Date Value Range Status  04/02/2013 8.8* 12.0 - 15.0 g/dL Final     DELTA CHECK NOTED     REPEATED TO VERIFY     HCT  Date Value Range Status  04/02/2013 27.4* 36.0 - 46.0 % Final    Physical Exam:  General: alert, cooperative and no distress Lochia: appropriate Uterine Fundus: firm Incision: n/a DVT Evaluation: No evidence of DVT seen on physical exam.  Discharge Diagnoses: S/P SVD with minor vaginal tear, not requiring sutures or repair.  Hx PCOS  Discharge Information: Date: 04/02/2013 Activity: pelvic rest Diet: routine Medications: PNV, Ibuprofen, Colace, Iron and Percocet Condition: stable Instructions: refer to practice specific booklet Discharge to: home Follow-up Information   Follow up with Dr Brandy FordyceKelly Fogleman, MD In 6 weeks.   Specialty:  Obstetrics and Gynecology   Contact information:   [address] [place] [id number] [phone]       Newborn Data: Live born female on 04/01/13 Birth Weight: 7 lb 4 oz (3289 g) APGAR: 9, 9  Home with mother.  Brandy Bradley K 04/02/2013, 8:49 PM

## 2013-04-03 ENCOUNTER — Encounter (HOSPITAL_COMMUNITY): Payer: Self-pay | Admitting: Obstetrics and Gynecology

## 2013-04-03 DIAGNOSIS — D62 Acute posthemorrhagic anemia: Secondary | ICD-10-CM

## 2013-04-03 HISTORY — DX: Acute posthemorrhagic anemia: D62

## 2013-04-03 MED ORDER — SENNOSIDES-DOCUSATE SODIUM 8.6-50 MG PO TABS: 2.0000 | ORAL_TABLET | Freq: Every evening | ORAL | Status: DC | PRN

## 2013-04-03 MED ORDER — OXYCODONE-ACETAMINOPHEN 5-325 MG PO TABS: 1.0000 | ORAL_TABLET | Freq: Four times a day (QID) | ORAL | Status: DC | PRN

## 2013-04-03 MED ORDER — POLYSACCHARIDE IRON COMPLEX 150 MG PO CAPS: 150.0000 mg | ORAL_CAPSULE | Freq: Two times a day (BID) | ORAL | Status: DC

## 2013-04-03 MED ORDER — POLYSACCHARIDE IRON COMPLEX 150 MG PO CAPS
150.0000 mg | ORAL_CAPSULE | Freq: Every day | ORAL | Status: DC
  Filled 2013-04-03: qty 1

## 2013-04-03 NOTE — Progress Notes (Signed)
Patient ID: Brandy Bradley, female   DOB: 06/05/1990, 23 y.o.   MRN: [id number] PPD # 2  Subjective: Pt reports feeling well and eager for d/c home/ Pain controlled with motrin and occasional percocet Tolerating po/ Voiding without problems/ No n/v Bleeding is light/ Newborn info:  Information for the patient's newborn:  Brandy Bradley, Girl Brandy Bradley [[id number]][[id number]]  female  Feeding: breast    Objective:  VS: Blood pressure 124/83, pulse 87, temperature 97.9 F (36.6 C), temperature source Oral, resp. rate 20.    Recent Labs  04/01/13 0310 04/02/13 0530  WBC 8.2 12.5*  HGB 11.3* 8.8*  HCT 34.8* 27.4*  PLT 207 220    Blood type: O POS Rubella: Immune    Physical Exam:  General:  alert, cooperative and no distress CV: Regular rate and rhythm Resp: clear Abdomen: soft, nontender, normal bowel sounds Uterine Fundus: firm, below umbilicus, nontender Perineum: not inspected; pt is OOB and dressed Lochia: minimal Ext: Homans sign is negative, no sign of DVT and no edema, redness or tenderness in the calves or thighs    A/P: PPD # 2/ G1P1001/ S/P:  SVD w/ 1st degree ABL Anemia Hx PCOS; urged low carb diet and healthy proteins, vegetables Doing well and stable for discharge home RX: Ibuprofen 600mg  po Q 6 hrs prn pain #30 Refill x 1 Niferex 150mg  po QD/BID #30/#60 Refill x 1 Percocet 5/325 1 to 2 po Q 6 hrs prn pain #15 No refill Colace 100mg  po up to TID prn #30 Ref x 1 WOB/GYN booklet given Routine pp visit in 6wks   Demetrius RevelFISHER,Ory Elting K, MSN, [facility] 04/03/2013, 8:35 AM

## 2013-04-03 NOTE — Lactation Note (Signed)
This note was copied from the chart of Brandy Bradley. Lactation Consultation Note Mom states breast feeding is going very well. States baby cluster fed last night, denies pain, denies concerns, states "I think we got this".  Enc mom to call the lactation office if she has any concerns, and to attend the BFSG.  Patient Name: Brandy Bradley Today's Date: 04/03/2013     Maternal Data    Feeding Feeding Type: Breast Fed Length of feed: 15 min  LATCH Score/Interventions Latch: Grasps breast easily, tongue down, lips flanged, rhythmical sucking.  Audible Swallowing: Spontaneous and intermittent  Type of Nipple: Everted at rest and after stimulation  Comfort (Breast/Nipple): Soft / non-tender     Hold (Positioning): No assistance needed to correctly position infant at breast.  LATCH Score: 10  Lactation Tools Discussed/Used     Consult Status      Lenard ForthSanders, [name] 04/03/2013, 11:03 AM

## 2013-04-04 NOTE — Discharge Summary (Signed)
Reviewed and agree with note and plan. V.Lamanda Rudder, MD  

## 2013-04-10 ENCOUNTER — Ambulatory Visit (HOSPITAL_COMMUNITY)
Admission: RE | Admit: 2013-04-10 | Discharge: 2013-04-10 | Disposition: A | Discharge status: Home / Self Care | Payer: No Typology Code available for payment source | Source: Ambulatory Visit | Attending: Obstetrics & Gynecology | Admitting: Obstetrics & Gynecology

## 2013-04-10 NOTE — Lactation Note (Signed)
Adult Lactation Consultation Outpatient Visit Note  Patient Name: Brandy Bradley(mother)    BABY: [name] Date of Birth: 1991/02/26                                             DOB:  Gestational Age at Delivery: 4554w4d                            BIRTH WEIGHT: 7-4 Type of Delivery: NVD                                                 DISCHARGE WEIGHT: 6-10(9%) -                                                                                    WEIGHT TODAY: 7-7.1 Breastfeeding History: Frequency of Breastfeeding: every 2 hours Length of Feeding: 10-15 minutes both breasts Voids: 6+ Stools: 6+ yellow  Supplementing / Method:Formula 30-60 mls pc per bottle Pumping:  Type of Pump:manual   Frequency:2-3 times per day  Volume:  15-60 mls  Comments:    Consultation Evaluation:Mom and 7 day old infant here for feeding assessment.  Mom started supplementing with formula a few days ago because baby was acting hungry.  Mom has many questions and concerns about feedings.  Questions answered and much teaching done.  Both breasts very full.  Baby latches easily and well to both breasts.  Baby nurses actively with many audible swallows.  She came off second breast very relaxed and content and this was pointed out to mom.  Much praise and reassurance given.  Discussed supply/demand at length and mom will work towards exclusively breastfeeding and weaning off the formula the next few days.  Mom is more comfortable decreasing the formula slowly.  Recommended she try to attend support groups as often as possible.  Encouraged to call [facility] office with any concerns or if another appointment is desired.  Initial Feeding Assessment:15 minutes each breast Pre-feed [id number]:3376 Post-feed MVHQIO:[id number]:3442 Amount Transferred:66 mls Comments:  Additional Feeding Assessment: Pre-feed Weight: Post-feed Weight: Amount Transferred: Comments:  Additional Feeding Assessment: Pre-feed Weight: Post-feed  Weight: Amount Transferred: Comments:  Total Breast milk Transferred this Visit: 66 mls Total Supplement Given: none  Additional Interventions:   Follow-Up  Will call prn and attend support groups if possible      Lenard ForthSanders, Elizabeth Fulmer 04/10/2013, 1:55 PM

## 2014-01-05 ENCOUNTER — Encounter (HOSPITAL_COMMUNITY): Payer: Self-pay | Admitting: Obstetrics and Gynecology

## 2014-01-08 ENCOUNTER — Ambulatory Visit (INDEPENDENT_AMBULATORY_CARE_PROVIDER_SITE_OTHER): Payer: No Typology Code available for payment source | Admitting: Family Medicine

## 2014-01-08 VITALS — BP 120/80 | HR 81 | Temp 98.8°F | Resp 16 | Ht 63.25 in | Wt 174.0 lb

## 2014-01-08 DIAGNOSIS — R7303 Prediabetes: Secondary | ICD-10-CM

## 2014-01-08 DIAGNOSIS — Z111 Encounter for screening for respiratory tuberculosis: Secondary | ICD-10-CM

## 2014-01-08 DIAGNOSIS — Z0000 Encounter for general adult medical examination without abnormal findings: Secondary | ICD-10-CM

## 2014-01-08 DIAGNOSIS — R7309 Other abnormal glucose: Secondary | ICD-10-CM

## 2014-01-08 NOTE — Progress Notes (Signed)
Subjective:  This chart was scribed for Brandy Staggers, MD by Brandy Bradley, ED Scribe at [facility]The patient was seen in exam room 11 and the patient's care was started at 5:58 PM.   Patient ID: Brandy Bradley, female    DOB: 1990/03/08, 23 y.o.   MRN: [id number]  HPI  HPI Comments: Brandy Bradley is a 23 y.o. 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 See attached note with screening questions. Positive for being born outside the [place], in [place], [place] and she has been living in the [place] for 5 years. Pt is pre-diabetic (insulin resistant) and takes metformin, pt says her grandmother is diabetic. Pt states she has some diarrhea as a side effect of the diarrhea. She sees Dr. Noreene Bradley for her diabetes. She notes no other medical problems. She has no communicable disease and has not been in contact with someone who has a communicable disease. Pt has no dentist. Pt states she does exercise and jogs with her child for an hour everyday. She had a normal PAP smear in march. She did not get Mammogram. Pt states her immunizations are up to date, she thinks Dr. Noreene Bradley has done all her immunizations. She denies any problem with her extremities, depression or anxiety. She denies any chance of STD and has no change in her sexual partner and activity. Pt does not wear glasses or contacts. She has never had any surgeries. 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  Patient Active Problem List   Diagnosis Date Noted  . SVD (spontaneous vaginal delivery) 04/03/2013  . Anemia due to blood loss, acute 04/03/2013  . Postpartum care following vaginal delivery (1/27) 04/01/2013   Past Medical History  Diagnosis Date  . Insulin resistance   . Anemia due to blood loss, acute 04/03/2013   Past Surgical History  Procedure Laterality Date  . No past surgeries       Allergies  Allergen Reactions  . Ibuprofen Other (See Comments)    Eye Swelling    Prior to Admission medications   Medication Sig Start Date End Date Taking? Authorizing Provider  metFORMIN (GLUCOPHAGE) 850 MG tablet Take 850 mg by mouth 2 (two) times daily with a meal.   Yes Historical Provider, MD  oxyCODONE-acetaminophen (PERCOCET/ROXICET) 5-325 MG per tablet Take 1-2 tablets by mouth every 6 (six) hours as needed for severe pain (moderate - severe pain). 04/03/13   Demetrius Revel, NP  pantoprazole (PROTONIX) 40 MG tablet Take 40 mg by mouth at bedtime.    Historical Provider, MD   History   Social History  . Marital Status: Married    Spouse Name: N/A    Number of Children: N/A  . Years of Education: N/A   Occupational History  . Not on file.   Social History Main Topics  . Smoking status: Never Smoker   . Smokeless tobacco: Never Used  . Alcohol Use: No  . Drug Use: No  . Sexual Activity: Yes    Birth Control/ Protection: IUD   Other Topics Concern  . Not on file   Social History Narrative    Review of Systems  Psychiatric/Behavioral: The patient is not nervous/anxious.   All other systems reviewed and are negative.      Objective:   Physical Exam  Constitutional: She  is oriented to person, place, and time. She appears well-developed and well-nourished.  HENT:  Head: Normocephalic and atraumatic.  Right Ear: External ear normal.  Left Ear: External ear normal.  Mouth/Throat: Oropharynx is clear and moist.  Eyes: Conjunctivae are normal. Pupils are equal, round, and reactive to light.  Neck: Normal range of motion. Neck supple. No thyromegaly present.  Cardiovascular: Normal rate, regular rhythm, normal heart sounds and intact distal pulses.   No murmur heard. Pulmonary/Chest: Effort normal and breath sounds normal. No respiratory distress. She has no wheezes.  Abdominal: Soft. Bowel sounds are normal. There is no tenderness.  Musculoskeletal: Normal  range of motion. She exhibits no edema or tenderness.  Lymphadenopathy:    She has no cervical adenopathy.  Neurological: She is alert and oriented to person, place, and time.  Skin: Skin is warm and dry. No rash noted.  Psychiatric: She has a normal mood and affect. Her behavior is normal. Thought content normal.    BP 120/80 mmHg  Pulse 81  Temp(Src) 98.8 F (37.1 C) (Oral)  Resp 16  Ht 5' 3.25" (1.607 m)  Wt 174 lb (78.926 kg)  BMI 30.56 kg/m2  SpO2 100%  LMP 01/07/2014       Assessment & Plan:   Brandy Bradley is a 23 y.o. female Annual physical exam Screening-pulmonary TB - Plan: TB Skin Test  - forms completed.   -anticipatory guidance as below in AVS. Health maintenance items discussed/recommended as applicable.   -ppd read in 48-72 hours.   Prediabetes  - tolerating metformin, cont follow up with OBGYN as scheduled.   Meds ordered this encounter  Medications  . metFORMIN (GLUCOPHAGE) 850 MG tablet    Sig: Take 850 mg by mouth 2 (two) times daily with a meal.   Patient Instructions  No concerns were indentified on your exam today. Keep follow up with Dr. Ernestina Bradley for your blood sugar. Return in 48 to 72 hours for your TB skin test to be read. [facility] at [facility]  Keeping You Healthy  Get These Tests 1. Blood Pressure- Have your blood pressure checked once a year by your health care provider.  Normal blood pressure is 120/80. 2. Weight- Have your body mass index (BMI) calculated to screen for obesity.  BMI is measure of body fat based on height and weight.  You can also calculate your own BMI at https://www.west-esparza.com/www.nhlbisupport.com/bmi/. 3. Cholesterol- Have your cholesterol checked every 5 years starting at age 23 then yearly starting at age 23. 4. Chlamydia, HIV, and other sexually transmitted diseases- Get screened every year until age 23, then within three months of each new sexual provider. 5. Pap Smear- Every 1-3 years; discuss with your health care  provider. 6. Mammogram- Every year starting at age 23  Take these medicines  Calcium with Vitamin D-Your body needs 1200 mg of Calcium each day and [phone] IU of Vitamin D daily.  Your body can only absorb 500 mg of Calcium at a time so Calcium must be taken in 2 or 3 divided doses throughout the day.  Multivitamin with folic acid- Once daily if it is possible for you to become pregnant.  Get these Immunizations  Gardasil-Series of three doses; prevents HPV related illness such as genital warts and cervical cancer.  Menactra-Single dose; prevents meningitis.  Tetanus shot- Every 10 years.  Flu shot-Every year.  Take these steps 1. Do not smoke-Your healthcare provider can help you quit.  For tips on how to quit  go to www.smokefree.gov or call 1-800 QUITNOW. 2. Be physically active- Exercise 5 days a week for at least 30 minutes.  If you are not already physically active, start slow and gradually work up to 30 minutes of moderate physical activity.  Examples of moderate activity include walking briskly, dancing, swimming, bicycling, etc. 3. Breast Cancer- A self breast exam every month is important for early detection of breast cancer.  For more information and instruction on self breast exams, ask your healthcare provider or [place].eswww.womenshealth.gov/faq/breast-self-exam.cfm. 4. Eat a healthy diet- Eat a variety of healthy foods such as fruits, vegetables, whole grains, low fat milk, low fat cheeses, yogurt, lean meats, poultry and fish, beans, nuts, tofu, etc.  For more information go to www. Thenutritionsource.org 5. Drink alcohol in moderation- Limit alcohol intake to one drink or less per day. Never drink and drive. 6. Depression- Your emotional health is as important as your physical health.  If you're feeling down or losing interest in things you normally enjoy please talk to your healthcare provider about being screened for depression. 7. Dental visit- Brush and floss your teeth twice  daily; visit your dentist twice a year. 8. Eye doctor- Get an eye exam at least every 2 years. 9. Helmet use- Always wear a helmet when riding a bicycle, motorcycle, rollerblading or skateboarding. 10. Safe sex- If you may be exposed to sexually transmitted infections, use a condom. 11. Seat belts- Seat belts can save your live; always wear one. 12. Smoke/Carbon Monoxide detectors- These detectors need to be installed on the appropriate level of your home. Replace batteries at least once a year. 13. Skin cancer- When out in the sun please cover up and use sunscreen 15 SPF or higher. 14. Violence- If anyone is threatening or hurting you, please tell your healthcare provider.        I personally performed the services described in this documentation, which was scribed in my presence. The recorded information has been reviewed and considered, and addended by me as needed.

## 2014-01-08 NOTE — Progress Notes (Signed)
  Tuberculosis Risk Questionnaire  1. Yes  Were you born outside the [place] in one of the following parts of the world: [place], [place], [place], [place] or [place]? [place]    2. No Have you traveled outside the [place] and lived for more than one month in one of the following parts of the world: [place], [place], [place], [place] or [place]?    3. No Do you have a compromised immune system such as from any of the following conditions:HIV/AIDS, organ or bone marrow transplantation, diabetes, immunosuppressive medicines (e.g. Prednisone, Remicaide), leukemia, lymphoma, cancer of the head or neck, gastrectomy or jejunal bypass, end-stage renal disease (on dialysis), or silicosis?     4. 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    5. No Have you ever: injected illegal drugs, used crack cocaine, lived in a homeless shelter  or been in jail or prison?     6. No Have you ever been exposed to anyone with infectious tuberculosis?    Tuberculosis Symptom Questionnaire  Do you currently have any of the following symptoms?  1. No Unexplained cough lasting more than 3 weeks?   2. No Unexplained fever lasting more than 3 weeks.   3. No Night Sweats (sweating that leaves the bedclothes and sheets wet)     4. No Shortness of Breath   5. No Chest Pain   6. No Unintentional weight loss    7. No Unexplained fatigue (very tired for no reason)

## 2014-01-08 NOTE — Patient Instructions (Addendum)
No concerns were indentified on your exam today. Keep follow up with Dr. Ernestina PennaFogleman for your blood sugar. Return in 48 to 72 hours for your TB skin test to be read. [facility] at [facility]!  Keeping You Healthy  Get These Tests 1. Blood Pressure- Have your blood pressure checked once a year by your health care provider.  Normal blood pressure is 120/80. 2. Weight- Have your body mass index (BMI) calculated to screen for obesity.  BMI is measure of body fat based on height and weight.  You can also calculate your own BMI at https://www.west-esparza.com/www.nhlbisupport.com/bmi/. 3. Cholesterol- Have your cholesterol checked every 5 years starting at age 23 then yearly starting at age 23. 4. Chlamydia, HIV, and other sexually transmitted diseases- Get screened every year until age 23, then within three months of each new sexual provider. 5. Pap Smear- Every 1-3 years; discuss with your health care provider. 6. Mammogram- Every year starting at age 23  Take these medicines  Calcium with Vitamin D-Your body needs 1200 mg of Calcium each day and [phone] IU of Vitamin D daily.  Your body can only absorb 500 mg of Calcium at a time so Calcium must be taken in 2 or 3 divided doses throughout the day.  Multivitamin with folic acid- Once daily if it is possible for you to become pregnant.  Get these Immunizations  Gardasil-Series of three doses; prevents HPV related illness such as genital warts and cervical cancer.  Menactra-Single dose; prevents meningitis.  Tetanus shot- Every 10 years.  Flu shot-Every year.  Take these steps 1. Do not smoke-Your healthcare provider can help you quit.  For tips on how to quit go to www.smokefree.gov or call 1-800 QUITNOW. 2. Be physically active- Exercise 5 days a week for at least 30 minutes.  If you are not already physically active, start slow and gradually work up to 30 minutes of moderate physical activity.  Examples of moderate activity include walking briskly, dancing, swimming,  bicycling, etc. 3. Breast Cancer- A self breast exam every month is important for early detection of breast cancer.  For more information and instruction on self breast exams, ask your healthcare provider or [place].eswww.womenshealth.gov/faq/breast-self-exam.cfm. 4. Eat a healthy diet- Eat a variety of healthy foods such as fruits, vegetables, whole grains, low fat milk, low fat cheeses, yogurt, lean meats, poultry and fish, beans, nuts, tofu, etc.  For more information go to www. Thenutritionsource.org 5. Drink alcohol in moderation- Limit alcohol intake to one drink or less per day. Never drink and drive. 6. Depression- Your emotional health is as important as your physical health.  If you're feeling down or losing interest in things you normally enjoy please talk to your healthcare provider about being screened for depression. 7. Dental visit- Brush and floss your teeth twice daily; visit your dentist twice a year. 8. Eye doctor- Get an eye exam at least every 2 years. 9. Helmet use- Always wear a helmet when riding a bicycle, motorcycle, rollerblading or skateboarding. 10. Safe sex- If you may be exposed to sexually transmitted infections, use a condom. 11. Seat belts- Seat belts can save your live; always wear one. 12. Smoke/Carbon Monoxide detectors- These detectors need to be installed on the appropriate level of your home. Replace batteries at least once a year. 13. Skin cancer- When out in the sun please cover up and use sunscreen 15 SPF or higher. 14. Violence- If anyone is threatening or hurting you, please tell your healthcare provider.

## 2014-01-11 ENCOUNTER — Ambulatory Visit (INDEPENDENT_AMBULATORY_CARE_PROVIDER_SITE_OTHER): Payer: No Typology Code available for payment source | Admitting: *Deleted

## 2014-01-11 DIAGNOSIS — Z111 Encounter for screening for respiratory tuberculosis: Secondary | ICD-10-CM

## 2014-01-11 LAB — TB SKIN TEST
Induration: 0 mm
TB Skin Test: NEGATIVE

## 2014-01-11 NOTE — Progress Notes (Signed)
   Subjective:    Patient ID: Brandy ReasonerAlejandra Ponce de Bradley, female    DOB: 1990/04/27, 23 y.o.   MRN: [id number]  HPI Patient is here for PPD read. She had is placed in her left forearm on 01/08/14 at 6:07 pm. She is within the appropriate 48-72 hour time window.    Review of Systems     Objective:   Physical Exam        Assessment & Plan:   Negative PPD test, 0 mm induration.

## 2014-04-05 ENCOUNTER — Ambulatory Visit: Payer: No Typology Code available for payment source

## 2015-04-09 ENCOUNTER — Encounter (HOSPITAL_COMMUNITY): Payer: Self-pay | Admitting: Emergency Medicine

## 2015-04-09 ENCOUNTER — Emergency Department (HOSPITAL_COMMUNITY)
Admission: EM | Admit: 2015-04-09 | Discharge: 2015-04-09 | Disposition: A | Discharge status: Home / Self Care | Payer: BLUE CROSS/BLUE SHIELD | Attending: Emergency Medicine | Admitting: Emergency Medicine

## 2015-04-09 ENCOUNTER — Emergency Department (HOSPITAL_COMMUNITY): Payer: BLUE CROSS/BLUE SHIELD

## 2015-04-09 DIAGNOSIS — K8020 Calculus of gallbladder without cholecystitis without obstruction: Secondary | ICD-10-CM | POA: Diagnosis not present

## 2015-04-09 DIAGNOSIS — Z3202 Encounter for pregnancy test, result negative: Secondary | ICD-10-CM | POA: Insufficient documentation

## 2015-04-09 DIAGNOSIS — Z7984 Long term (current) use of oral hypoglycemic drugs: Secondary | ICD-10-CM | POA: Diagnosis not present

## 2015-04-09 DIAGNOSIS — Z79899 Other long term (current) drug therapy: Secondary | ICD-10-CM | POA: Insufficient documentation

## 2015-04-09 DIAGNOSIS — K219 Gastro-esophageal reflux disease without esophagitis: Secondary | ICD-10-CM | POA: Diagnosis not present

## 2015-04-09 DIAGNOSIS — Z862 Personal history of diseases of the blood and blood-forming organs and certain disorders involving the immune mechanism: Secondary | ICD-10-CM | POA: Diagnosis not present

## 2015-04-09 DIAGNOSIS — R1033 Periumbilical pain: Secondary | ICD-10-CM | POA: Diagnosis present

## 2015-04-09 DIAGNOSIS — Z8639 Personal history of other endocrine, nutritional and metabolic disease: Secondary | ICD-10-CM | POA: Insufficient documentation

## 2015-04-09 LAB — URINE MICROSCOPIC-ADD ON: RBC / HPF: NONE SEEN RBC/hpf (ref 0–5)

## 2015-04-09 LAB — CBC WITH DIFFERENTIAL/PLATELET

## 2015-04-09 LAB — URINALYSIS, ROUTINE W REFLEX MICROSCOPIC
Bilirubin Urine: NEGATIVE
GLUCOSE, UA: NEGATIVE mg/dL
Hgb urine dipstick: NEGATIVE
KETONES UR: NEGATIVE mg/dL
Nitrite: NEGATIVE
PROTEIN: NEGATIVE mg/dL
Specific Gravity, Urine: 1.018 (ref 1.005–1.030)
pH: 6 (ref 5.0–8.0)

## 2015-04-09 LAB — COMPREHENSIVE METABOLIC PANEL
ALBUMIN: 4.1 g/dL (ref 3.5–5.0)
ALT: 18 U/L (ref 14–54)
AST: 28 U/L (ref 15–41)
Alkaline Phosphatase: 102 U/L (ref 38–126)
Anion gap: 10 (ref 5–15)
BUN: 10 mg/dL (ref 6–20)
CHLORIDE: 103 mmol/L (ref 101–111)
CO2: 23 mmol/L (ref 22–32)
CREATININE: 0.59 mg/dL (ref 0.44–1.00)
Calcium: 9.4 mg/dL (ref 8.9–10.3)
GFR calc non Af Amer: >60 mL/min (ref >60)
Glucose, Bld: 105 mg/dL — ABNORMAL HIGH (ref 65–99)
POTASSIUM: 3.7 mmol/L (ref 3.5–5.1)
Sodium: 136 mmol/L (ref 135–145)
Total Bilirubin: 0.4 mg/dL (ref 0.3–1.2)
Total Protein: 7.4 g/dL (ref 6.5–8.1)

## 2015-04-09 LAB — LIPASE, BLOOD: LIPASE: 39 U/L (ref 11–51)

## 2015-04-09 LAB — POC URINE PREG, ED: PREG TEST UR: NEGATIVE

## 2015-04-09 MED ORDER — TRAMADOL HCL 50 MG PO TABS: 50.0000 mg | ORAL_TABLET | Freq: Two times a day (BID) | ORAL | Status: DC | PRN

## 2015-04-09 MED ORDER — SODIUM CHLORIDE 0.9 % IV BOLUS (SEPSIS)
1000.0000 mL | Freq: Once | INTRAVENOUS | Status: AC
  Administered 2015-04-09: 1000 mL via INTRAVENOUS

## 2015-04-09 MED ORDER — SUCRALFATE 1 GM/10ML PO SUSP
1.0000 g | Freq: Once | ORAL | Status: AC
  Administered 2015-04-09: 1 g via ORAL
  Filled 2015-04-09: qty 10

## 2015-04-09 MED ORDER — RANITIDINE HCL 150 MG/10ML PO SYRP
300.0000 mg | ORAL_SOLUTION | Freq: Once | ORAL | Status: AC
  Administered 2015-04-09: 300 mg via ORAL
  Filled 2015-04-09: qty 20

## 2015-04-09 MED ORDER — HYDROMORPHONE HCL 1 MG/ML IJ SOLN
1.0000 mg | Freq: Once | INTRAMUSCULAR | Status: AC
  Administered 2015-04-09: 1 mg via INTRAMUSCULAR
  Filled 2015-04-09: qty 1

## 2015-04-09 NOTE — ED Notes (Signed)
Per pt  Abdominal pain since around 0000. Felt like heartburn then it went away Pain continued to worsen and has not gone away, pt is also having back pain. Pt has tried to change positions to relieve pain, unsuccessful.  Pt tearful and visibly pained.

## 2015-04-09 NOTE — ED Notes (Signed)
Patient transported to Ultrasound 

## 2015-04-09 NOTE — ED Provider Notes (Signed)
CSN: [id number]     Arrival date & time 04/09/15  0225 History  By signing my name below, I, Brandy Bradley, attest that this documentation has been prepared under the direction and in the presence of Brandy Crumble, MD. Electronically Signed: Lyndel Bradley, ED Scribe. 04/09/2015. 2:55 AM.   Chief Complaint  Patient presents with  . Abdominal Pain   The history is provided by the patient. No language interpreter was used.   HPI Comments: Brandy Bradley Brandy Bradley is a 25 y.o. female, with a h/o GERD, who presents to the Emergency Department complaining of sudden onset, intermittent, severe periumbilical abdominal pain onset 2 hours ago while sleeping. She describes the pain at onset to have been similar to GERD. She reports intermittent radiation of the pain to her back. She notes she attempted to induce vomiting to try and alleviate the pain but was unable to do so. The pt has not taken any alleviating medication PTA.  She denies diarrhea, hematuria, dysuria, nausea, vomiting, abnormal vaginal bleeding or discharge. No PShx to abdomen.   Past Medical History  Diagnosis Date  . Insulin resistance   . Anemia due to blood loss, acute 04/03/2013   Past Surgical History  Procedure Laterality Date  . No past surgeries     Family History  Problem Relation Age of Onset  . Diabetes Maternal Grandmother    Social History  Substance Use Topics  . Smoking status: Never Smoker   . Smokeless tobacco: Never Used  . Alcohol Use: No   OB History    Gravida Para Term Preterm AB TAB SAB Ectopic Multiple Living   Review of Systems  Gastrointestinal: Positive for abdominal pain. Negative for nausea, vomiting and diarrhea.  Genitourinary: Negative for dysuria, hematuria, vaginal bleeding and vaginal discharge.  Musculoskeletal: Positive for back pain.  A complete 10 system review of systems was obtained and is otherwise negative except at noted in the HPI and PMH.  Allergies   Ibuprofen  Home Medications   Prior to Admission medications   Medication Sig Start Date End Date Taking? Authorizing Provider  metFORMIN (GLUCOPHAGE) 850 MG tablet Take 850 mg by mouth 2 (two) times daily with a meal.    Historical Provider, MD  oxyCODONE-acetaminophen (PERCOCET/ROXICET) 5-325 MG per tablet Take 1-2 tablets by mouth every 6 (six) hours as needed for severe pain (moderate - severe pain). 04/03/13   Arlana Lindau, NP  pantoprazole (PROTONIX) 40 MG tablet Take 40 mg by mouth at bedtime.    Historical Provider, MD   BP 111/98 mmHg  Pulse 92  Temp(Src) 98.1 F (36.7 C) (Oral)  Resp 16  Ht  (1.626 m)  Wt 160 lb (72.576 kg)  BMI 27.45 kg/m2  SpO2 100%  LMP 03/22/2015 (Exact Date) Physical Exam  Constitutional: She is oriented to person, place, and time. She appears well-developed and well-nourished. No distress.  HENT:  Head: Normocephalic and atraumatic.  Nose: Nose normal.  Mouth/Throat: Oropharynx is clear and moist. No oropharyngeal exudate.  Eyes: Conjunctivae and EOM are normal. Pupils are equal, round, and reactive to light. No scleral icterus.  Neck: Normal range of motion. Neck supple. No JVD present. No tracheal deviation present. No thyromegaly present.  Cardiovascular: Normal rate, regular rhythm and normal heart sounds.  Exam reveals no gallop and no friction rub.   No murmur heard. Pulmonary/Chest: Effort normal and breath sounds normal. No respiratory distress. She  has no wheezes. She exhibits no tenderness.  Abdominal: Soft. Bowel sounds are normal. She exhibits no distension and no mass. There is no tenderness. There is no rebound and no guarding.  Musculoskeletal: Normal range of motion. She exhibits no edema or tenderness.  Lymphadenopathy:    She has no cervical adenopathy.  Neurological: She is alert and oriented to person, place, and time. No cranial nerve deficit. She exhibits normal muscle tone.  Skin: Skin is warm and dry. No rash noted. No  erythema. No pallor.  Nursing note and vitals reviewed.   ED Course  Procedures  DIAGNOSTIC STUDIES: Oxygen Saturation is 100% on RA, normal by my interpretation.    COORDINATION OF CARE: 2:55 AM Discussed treatment plan with pt. Pt acknowledges and agrees to plan.   Labs Review Labs Reviewed  COMPREHENSIVE METABOLIC PANEL - Abnormal; Notable for the following:    Glucose, Bld 105 (*)    All other components within normal limits  URINALYSIS, ROUTINE W REFLEX MICROSCOPIC (NOT AT [facility]) - Abnormal; Notable for the following:    Leukocytes, UA SMALL (*)    All other components within normal limits  URINE MICROSCOPIC-ADD ON - Abnormal; Notable for the following:    Squamous Epithelial / LPF 0-5 (*)    Bacteria, UA RARE (*)    All other components within normal limits  CBC WITH DIFFERENTIAL/PLATELET  LIPASE, BLOOD  POC URINE PREG, ED    Imaging Review US Abdomen Complete  04/09/2015  CLINICAL DATA:  Right upper and lower quadrant pain EXAM: ABDOMEN ULTRASOUND COMPLETE COMPARISON:  None. FINDINGS: Gallbladder: Gallstones without wall thickening or focal tenderness. Common bile duct: Diameter: 3 mm. Where visualized, no filling defect. Liver: No focal lesion identified. Within normal limits in parenchymal echogenicity. IVC: No abnormality visualized. Pancreas: Visualized portion unremarkable. Spleen: Size and appearance within normal limits. Right Kidney: Length: 9 cm. Echogenicity within normal limits. No mass or hydronephrosis visualized. Left Kidney: Length: 10 cm. Echogenicity within normal limits. No mass or hydronephrosis visualized. Abdominal aorta: No aneurysm visualized. IMPRESSION: 1. Cholelithiasis. 2. No acute findings. Electronically Signed   By: Marnee Spring M.D.   On: 04/09/2015 03:36   I have personally reviewed and evaluated these images and lab results as part of my medical decision-making.   EKG Interpretation None      MDM   Final diagnoses:  None   Patient  presents to the emergency department for abdominal pain. She states it feels like her heartburn however it has come back. Ultrasound reveals cholelithiasis, likely the cause of the patient's pain. Her pain is improved after Dilaudid was given. Education provided, will give general surgery follow-up. She appears well and in no acute distress, vital signs remain within her normal limits and she is Bradley for discharge.    I personally performed the services described in this documentation, which was scribed in my presence. The recorded information has been reviewed and is accurate.     Brandy Crumble, MD 04/09/15 [phone]

## 2015-04-09 NOTE — Discharge Instructions (Signed)
Colelitiasis (Cholelithiasis) Brandy Bradley, your ultrasound shows gallstones. See surgery within 3 days for close follow-up. Take Tylenol or ibuprofen for pain.  If your pain becomes severe, take tramadol.   If any symptoms worsen come back to emergency department immediately. Thank you.    [name], su ultrasonido muestra clculos biliares. Vea ciruga dentro de 3 das para un seguimiento cercano. Tomar Tylenol o ibuprofen para el dolor. Si su dolor se vuelve severo, tome tramadol. Si los sntomas empeoran, regrese inmediatamente al servicio de [place] Gr[employer]ias.  La colelitiasis (tambin llamada clculos en la vescula) es una enfermedad de la vescula. La vescula es un rgano pequeo que ayuda a digerir las [name]. Los sntomas de clculos en la vescula son:  [name] de vomitar (nuseas).  Devolver la comida (vomitar).  Dolor en el vientre.  Piel amarilla (ictericia)  Dolor sbito. Podr sentir Chief Technology Officer durante algunos minutos o unas horas.  [name].  Dolor a Insurance claims handler. CUIDADOS EN EL HOGAR  Tome slo los medicamentos segn le haya indicado el mdico.  Siga una dieta baja en grasas hasta que vuelva a ver al mdico nuevamente. Consumir grasas puede Programmer, multimedia.  Concurra a las consultas de control con el mdico, segn las indicaciones. Los ataques generalmente ocurren repetidas veces. Generalmente se requiere de [employer]. SOLICITE AYUDA DE INMEDIATO SI:   El dolor empeora.  El dolor no se alivia con los [employer].  Tiene fiebre o sntomas que persisten durante ms de 2-3 das.  Tiene fiebre y los sntomas empeoran repentinamente.  Siente nuseas y vomita. ASEGRESE DE QUE:   Comprende estas instrucciones.  Controlar su afeccin.  Recibir ayuda de inmediato si no mejora o si empeora.   Esta informacin no tiene Theme park manager el consejo del mdico. Asegrese de hacerle al mdico cualquier pregunta que  tenga.   Document Released: 05/19/2008 Document Revised: 10/23/2012 Elsevier Interactive Patient Education [employer]

## 2015-04-09 NOTE — ED Notes (Signed)
Pt returned to room from US.

## 2015-11-08 ENCOUNTER — Emergency Department (HOSPITAL_COMMUNITY)
Admission: EM | Admit: 2015-11-08 | Discharge: 2015-11-08 | Disposition: A | Discharge status: Home / Self Care | Payer: Self-pay | Attending: Emergency Medicine | Admitting: Emergency Medicine

## 2015-11-08 ENCOUNTER — Encounter (HOSPITAL_COMMUNITY): Payer: Self-pay | Admitting: Emergency Medicine

## 2015-11-08 ENCOUNTER — Emergency Department (HOSPITAL_COMMUNITY): Payer: Self-pay

## 2015-11-08 DIAGNOSIS — K8070 Calculus of gallbladder and bile duct without cholecystitis without obstruction: Secondary | ICD-10-CM | POA: Insufficient documentation

## 2015-11-08 DIAGNOSIS — K8050 Calculus of bile duct without cholangitis or cholecystitis without obstruction: Secondary | ICD-10-CM

## 2015-11-08 DIAGNOSIS — R1013 Epigastric pain: Secondary | ICD-10-CM

## 2015-11-08 LAB — URINALYSIS, ROUTINE W REFLEX MICROSCOPIC
BILIRUBIN URINE: NEGATIVE
Glucose, UA: NEGATIVE mg/dL
HGB URINE DIPSTICK: NEGATIVE
KETONES UR: NEGATIVE mg/dL
Nitrite: NEGATIVE
PROTEIN: NEGATIVE mg/dL
Specific Gravity, Urine: 1.014 (ref 1.005–1.030)
pH: 6 (ref 5.0–8.0)

## 2015-11-08 LAB — URINE MICROSCOPIC-ADD ON: RBC / HPF: NONE SEEN RBC/hpf (ref 0–5)

## 2015-11-08 LAB — CBC
HCT: 34.6 % — ABNORMAL LOW (ref 36.0–46.0)
Hemoglobin: 10.4 g/dL — ABNORMAL LOW (ref 12.0–15.0)
MCH: 22.1 pg — AB (ref 26.0–34.0)
MCHC: 30.1 g/dL (ref 30.0–36.0)
MCV: 73.5 fL — ABNORMAL LOW (ref 78.0–100.0)
PLATELETS: 311 10*3/uL (ref 150–400)
RBC: 4.71 MIL/uL (ref 3.87–5.11)
RDW: 18.1 % — AB (ref 11.5–15.5)
WBC: 6.1 10*3/uL (ref 4.0–10.5)

## 2015-11-08 LAB — COMPREHENSIVE METABOLIC PANEL
ALK PHOS: 98 U/L (ref 38–126)
ALT: 18 U/L (ref 14–54)
AST: 25 U/L (ref 15–41)
Albumin: 3.7 g/dL (ref 3.5–5.0)
Anion gap: 5 (ref 5–15)
BUN: 10 mg/dL (ref 6–20)
CALCIUM: 8.7 mg/dL — AB (ref 8.9–10.3)
CO2: 24 mmol/L (ref 22–32)
CREATININE: 0.58 mg/dL (ref 0.44–1.00)
Chloride: 105 mmol/L (ref 101–111)
GFR calc non Af Amer: >60 mL/min (ref >60)
Glucose, Bld: 96 mg/dL (ref 65–99)
Potassium: 3.6 mmol/L (ref 3.5–5.1)
Sodium: 134 mmol/L — ABNORMAL LOW (ref 135–145)
Total Bilirubin: 0.3 mg/dL (ref 0.3–1.2)
Total Protein: 6.2 g/dL — ABNORMAL LOW (ref 6.5–8.1)

## 2015-11-08 LAB — I-STAT BETA HCG BLOOD, ED (MC, WL, AP ONLY)

## 2015-11-08 LAB — LIPASE, BLOOD: Lipase: 35 U/L (ref 11–51)

## 2015-11-08 MED ORDER — ONDANSETRON HCL 4 MG/2ML IJ SOLN
4.0000 mg | Freq: Once | INTRAMUSCULAR | Status: AC
  Administered 2015-11-08: 4 mg via INTRAVENOUS
  Filled 2015-11-08: qty 2

## 2015-11-08 MED ORDER — HYDROCODONE-ACETAMINOPHEN 5-325 MG PO TABS: ORAL_TABLET | ORAL | 0 refills | Status: DC

## 2015-11-08 MED ORDER — FENTANYL CITRATE (PF) 100 MCG/2ML IJ SOLN
50.0000 ug | INTRAMUSCULAR | Status: AC | PRN
  Administered 2015-11-08 (×2): 50 ug via INTRAVENOUS
  Filled 2015-11-08 (×2): qty 2

## 2015-11-08 NOTE — ED Provider Notes (Signed)
[facility]-EMERGENCY DEPT Provider Note   CSN: [id number] Arrival date & time: 11/08/15  0532     History   Chief Complaint Chief Complaint  Patient presents with  . Abdominal Pain     Blood pressure 125/84, pulse 76, temperature 99 F (37.2 C), resp. rate 20, SpO2 100 %, unknown if currently breastfeeding.  Brandy Bon de Shon Bradley is a 25 y.o. female complaining of severe epigastric pain which woke her from sleep at 4 AM associated with nausea and no fever, chills, vomiting, diarrhea, change in urination, sick contacts, chest pain or shortness of breath. Took Tylenol at home with some relief, pain was originally an 8 out of 10, it then reduce to a 1 out of 10. Patient states it feels similar to an episode of biliary colic she had in the last several months. She's not seen a Careers adviser on this. She been in her normal state of health leading up to this, no postprandial pain nausea or vomiting. Patient states that she has intermittent or rhinitis over the last several months, she has seen an allergist but they declined allergy testing as there is no associated rash. Patient denies alcohol use, excessive Tylenol use, jaundice.  HPI  Past Medical History:  Diagnosis Date  . Anemia due to blood loss, acute 04/03/2013  . Insulin resistance     Patient Active Problem List   Diagnosis Date Noted  . SVD (spontaneous vaginal delivery) 04/03/2013  . Anemia due to blood loss, acute 04/03/2013  . Postpartum care following vaginal delivery (1/27) 04/01/2013    Past Surgical History:  Procedure Laterality Date  . NO PAST SURGERIES      OB History    Gravida Para Term Preterm AB Living   1 1 1     1    SAB TAB Ectopic Multiple Live Births           1       Home Medications    Prior to Admission medications   Medication Sig Start Date End Date Taking? Authorizing Provider  levonorgestrel (MIRENA) 20 MCG/24HR IUD 1 each by Intrauterine route once.   Yes Historical Provider, MD  Multiple  Vitamin (MULTIVITAMIN WITH MINERALS) TABS tablet Take 1 tablet by mouth daily.   Yes Historical Provider, MD  HYDROcodone-acetaminophen (NORCO/VICODIN) 5-325 MG tablet Take 1-2 tablets by mouth every 6 hours as needed for pain and/or cough. 11/08/15   Joni Reining Erian Lariviere, PA-C    Family History Family History  Problem Relation Age of Onset  . Diabetes Maternal Grandmother     Social History Social History  Substance Use Topics  . Smoking status: Never Smoker  . Smokeless tobacco: Never Used  . Alcohol use No     Allergies   Ibuprofen   Review of Systems Review of Systems  10 systems reviewed and found to be negative, except as noted in the HPI.  Physical Exam Updated Vital Signs BP 116/81   Pulse 78   Temp 99 F (37.2 C)   Resp 20   SpO2 100%   Physical Exam  Constitutional: She is oriented to person, place, and time. She appears well-developed and well-nourished. No distress.  HENT:  Head: Normocephalic and atraumatic.  Mouth/Throat: Oropharynx is clear and moist.  Eyes: Conjunctivae and EOM are normal. Pupils are equal, round, and reactive to light.  Neck: Normal range of motion.  Cardiovascular: Normal rate, regular rhythm and intact distal pulses.   Pulmonary/Chest: Effort normal and breath sounds normal.  Abdominal: Soft. There  is no tenderness.  No tenderness to deep palpation of any quadrant, normoactive bowel sounds.  Musculoskeletal: Normal range of motion.  Neurological: She is alert and oriented to person, place, and time.  Skin: She is not diaphoretic.  Psychiatric: She has a normal mood and affect.  Nursing note and vitals reviewed.    ED Treatments / Results  Labs (all labs ordered are listed, but only abnormal results are displayed) Labs Reviewed  COMPREHENSIVE METABOLIC PANEL - Abnormal; Notable for the following:       Result Value   Sodium 134 (*)    Calcium 8.7 (*)    Total Protein 6.2 (*)    All other components within normal limits    CBC - Abnormal; Notable for the following:    Hemoglobin 10.4 (*)    HCT 34.6 (*)    MCV 73.5 (*)    MCH 22.1 (*)    RDW 18.1 (*)    All other components within normal limits  URINALYSIS, ROUTINE W REFLEX MICROSCOPIC (NOT [facility]) - Abnormal; Notable for the following:    APPearance CLOUDY (*)    Leukocytes, UA SMALL (*)    All other components within normal limits  URINE MICROSCOPIC-ADD ON - Abnormal; Notable for the following:    Squamous Epithelial / LPF 0-5 (*)    Bacteria, UA RARE (*)    All other components within normal limits  LIPASE, BLOOD  I-STAT BETA HCG BLOOD, ED ([facility], [facility], [facility] ONLY)    EKG  EKG Interpretation None       Radiology [place] Abdomen Limited Ruq  Result Date: 11/08/2015 CLINICAL DATA:  Acute epigastric pain. EXAM: US ABDOMEN LIMITED - RIGHT UPPER QUADRANT COMPARISON:  04/09/2015 FINDINGS: Gallbladder: Multiple gallstones again seen, now including a 2 cm stone in the gallbladder neck. No wall thickening. No sonographic Murphy sign noted by sonographer. No pericholecystic fluid. Common bile duct: Diameter: 3 mm Liver: No focal lesion identified. Within normal limits in parenchymal echogenicity. IMPRESSION: Multiple gallstones including a 2 cm stone in the gallbladder neck. No signs of acute cholecystitis. Electronically Signed   By: Sebastian AcheAllen  Grady M.D.   On: 11/08/2015 08:03    Procedures Procedures (including critical care time)  Medications Ordered in ED Medications  ondansetron (ZOFRAN) injection 4 mg (4 mg Intravenous Given 11/08/15 0606)  fentaNYL (SUBLIMAZE) injection 50 mcg (50 mcg Intravenous Given 11/08/15 0702)     Initial Impression / Assessment and Plan / ED Course  I have reviewed the triage vital signs and the nursing notes.  Pertinent labs & imaging results that were available during my care of the patient were reviewed by me and considered in my medical decision making (see chart for details).  Clinical Course    Vitals:   11/08/15 0747  11/08/15 0800 11/08/15 0830 11/08/15 0930  BP: 117/82 114/83 96/83 116/81  Pulse: 69 73 83 78  Resp: 20     Temp:      SpO2: 100% 100% 99% 100%    Medications  ondansetron (ZOFRAN) injection 4 mg (4 mg Intravenous Given 11/08/15 0606)  fentaNYL (SUBLIMAZE) injection 50 mcg (50 mcg Intravenous Given 11/08/15 [id number])    Brandy BonAlejandra Ponce de Shon Bradley is 25 y.o. female presenting with Severe epigastric abdominal pain onset this morning, woke her from sleep, associated with nausea but no emesis, patient is afebrile, overall well appearing, abdominal exam is reassuring. Patient reports improvement in her symptoms. Blood work is reassuring. Ultrasound shows multiple gallstones, with a 2 cm  one is in the neck of the gallbladder. I discussed this with general surgery mid-level broke, patient is given the option of staying in the hospital and being admitted for possible cholecystectomy tomorrow, she feels comfortable going home and will make an appointment as an outpatient. Wound had multiple discussion of return precautions and patient verbalizes her understanding and teach back technique.  Discussed case with attending physician who agrees with care plan and disposition.   Evaluation does not show pathology that would require ongoing emergent intervention or inpatient treatment. Pt is hemodynamically stable and mentating appropriately. Discussed findings and plan with patient/guardian, who agrees with care plan. All questions answered. Return precautions discussed and outpatient follow up given.      Final Clinical Impressions(s) / ED Diagnoses   Final diagnoses:  Epigastric pain  Biliary colic    New Prescriptions New Prescriptions   HYDROCODONE-ACETAMINOPHEN (NORCO/VICODIN) 5-325 MG TABLET    Take 1-2 tablets by mouth every 6 hours as needed for pain and/or cough.     Wynetta Emery, PA-C 11/08/15 1023    Doug Sou, MD 11/08/15 8119

## 2015-11-08 NOTE — ED Notes (Signed)
Pt ambulated to bathroom, tolerated well.

## 2015-11-08 NOTE — ED Triage Notes (Signed)
Pt abdominal pain started at 0400. Denies N/V/D. Pt was seen before with similar pain and was dx with gallstones

## 2015-11-08 NOTE — Discharge Instructions (Signed)
Take vicodin for breakthrough pain, do not drink alcohol, drive, care for children or do other critical tasks while taking vicodin. ° ° °Do not hesitate to return to the emergency room for any new, worsening or concerning symptoms. ° °Please obtain primary care using resource guide below. Let them know that you were seen in the emergency room and that they will need to obtain records for further outpatient management. ° ° °

## 2015-12-09 ENCOUNTER — Ambulatory Visit: Payer: Self-pay | Admitting: General Surgery

## 2015-12-09 NOTE — H&P (Signed)
History of Present Illness Brandy Bradley(Brandy Spease MD; 11/16/2015 9:28 AM) Patient words: GB.  The patient is a 25 year old female who presents for evaluation of gall stones. The patient is a 25 year old female who is referred by Brandy GainerMoses Bradley for evaluation of significant gallstones. Patient states that she's had 2 episodes of epigastric abdominal pain. These both sensitive to the ER. Patient underwent ultrasound revealed a 2 cm gallstone. Patient states that the pain started after eating high fatty meal.  Patient states that she has no other relieving factors.  Patient has no other past medical history or surgical history. She is allergic to ibuprofen.   Other Problems (Brandy Eversole, LPN; 0/10/27259/02/2016 3:669:17 AM) Cholelithiasis  Past Surgical History (Brandy Eversole, LPN; 4/40/34749/02/2016 2:599:17 AM) No pertinent past surgical history  Diagnostic Studies History (Brandy Eversole, LPN; 5/63/87569/02/2016 4:339:17 AM) Colonoscopy never Mammogram 1-3 years ago Pap Smear 1-5 years ago  Allergies (Brandy Eversole, LPN; 2/95/18849/02/2016 1:669:20 AM) Ibuprofen *ANALGESICS - ANTI-INFLAMMATORY*  Medication History (Brandy Eversole, LPN; 0/63/01609/02/2016 1:099:20 AM) Xyzal (5MG  Tablet, Oral) Active. Medications Reconciled  Social History (Brandy Eversole, LPN; 3/23/55739/02/2016 2:209:17 AM) Caffeine use Coffee. No alcohol use No drug use Tobacco use Never smoker.  Family History Brandy Bradley(Brandy Eversole, LPN; 2/54/27069/02/2016 2:379:17 AM) First Degree Relatives No pertinent family history  Pregnancy / Birth History Brandy Bradley(Brandy Eversole, LPN; 6/28/31519/02/2016 7:619:17 AM) Age at menarche 12 years. Contraceptive History Intrauterine device. Gravida 1 Length (months) of breastfeeding 3-6 Maternal age 25-25 Para 1 Regular periods    Review of Systems (Brandy Eversole LPN; 6/07/37109/02/2016 6:269:17 AM) General Not Present- Appetite Loss, Chills, Fatigue, Fever, Night Sweats, Weight Gain and Weight Loss. Skin Present- Dryness and Rash. Not Present- Change in Wart/Mole,  Hives, Jaundice, New Lesions, Non-Healing Wounds and Ulcer. HEENT Not Present- Earache, Hearing Loss, Hoarseness, Nose Bleed, Oral Ulcers, Ringing in the Ears, Seasonal Allergies, Sinus Pain, Sore Throat, Visual Disturbances, Wears glasses/contact lenses and Yellow Eyes. Respiratory Not Present- Bloody sputum, Chronic Cough, Difficulty Breathing, Snoring and Wheezing. Breast Not Present- Breast Mass, Breast Pain, Nipple Discharge and Skin Changes. Cardiovascular Not Present- Chest Pain, Difficulty Breathing Lying Down, Leg Cramps, Palpitations, Rapid Heart Rate, Shortness of Breath and Swelling of Extremities. Gastrointestinal Present- Abdominal Pain, Bloating and Nausea. Not Present- Bloody Stool, Change in Bowel Habits, Chronic diarrhea, Constipation, Difficulty Swallowing, Excessive gas, Gets full quickly at meals, Hemorrhoids, Indigestion, Rectal Pain and Vomiting. Female Genitourinary Not Present- Frequency, Nocturia, Painful Urination, Pelvic Pain and Urgency. Musculoskeletal Not Present- Back Pain, Joint Pain, Joint Stiffness, Muscle Pain, Muscle Weakness and Swelling of Extremities. Neurological Not Present- Decreased Memory, Fainting, Headaches, Numbness, Seizures, Tingling, Tremor, Trouble walking and Weakness. Psychiatric Not Present- Anxiety, Bipolar, Change in Sleep Pattern, Depression, Fearful and Frequent crying. Endocrine Not Present- Cold Intolerance, Excessive Hunger, Hair Changes, Heat Intolerance, Hot flashes and New Diabetes. Hematology Not Present- Blood Thinners, Easy Bruising, Excessive bleeding, Gland problems, HIV and Persistent Infections.  Vitals (Brandy Eversole LPN; 9/48/54629/02/2016 7:039:19 AM) 11/16/2015 9:18 AM Weight: 170.2 lb Height: 64in Body Surface Area: 1.83 m Body Mass Index: 29.21 kg/m  Temp.: 97.62F(Oral)  Pulse: 90 (Regular)  BP: 122/82 (Sitting, Left Arm, Standard)       Physical Exam Brandy Bradley(Brandy Al, MD; 11/16/2015 9:29 AM) General Mental  Status-Alert. General Appearance-Consistent with stated age. Hydration-Well hydrated. Voice-Normal.  Head and Neck Head-normocephalic, atraumatic with no lesions or palpable masses.  Eye Eyeball - Bilateral-Extraocular movements intact. Sclera/Conjunctiva - Bilateral-No scleral icterus.  Chest and Lung Exam Chest and lung exam reveals -quiet,  even and easy respiratory effort with no use of accessory muscles. Inspection Chest Wall - Normal. Back - normal.  Cardiovascular Cardiovascular examination reveals -normal heart sounds, regular rate and rhythm with no murmurs.  Abdomen Inspection Normal Exam - No Hernias. Palpation/Percussion Normal exam - Soft, Non Tender, No Rebound tenderness, No Rigidity (guarding) and No hepatosplenomegaly. Auscultation Normal exam - Bowel sounds normal.  Neurologic Neurologic evaluation reveals -alert and oriented x 3 with no impairment of recent or remote memory. Mental Status-Normal.  Musculoskeletal Normal Exam - Left-Upper Extremity Strength Normal and Lower Extremity Strength Normal. Normal Exam - Right-Upper Extremity Strength Normal, Lower Extremity Weakness.    Assessment & Plan Brandy Filler MD; 11/16/2015 9:28 AM) SYMPTOMATIC CHOLELITHIASIS (K80.20) Impression: 25 year old female with cholelithiasis  1. We will proceed to the operating room for a laparoscopic cholecystectomy 2. Risks and benefits were discussed with the patient to generally include, but not limited to: infection, bleeding, possible need for post op ERCP, damage to the bile ducts, bile leak, and possible need for further surgery. Alternatives were offered and described. All questions were answered and the patient voiced understanding of the procedure and wishes to proceed at this point with a laparoscopic cholecystectomy

## 2016-01-04 NOTE — Pre-Procedure Instructions (Signed)
Brandy Bradley  01/04/2016      [facility] - [place], [place] - [address] [place] [id number] Phone: [phone] Fax: [phone]    Your procedure is scheduled on Friday November 10  [facility] to [facility] Admitting at 1000 A.M.  Call this number if you have problems the morning of surgery:  [phone]   Remember:  Do not eat food or drink liquids after midnight.   Take these medicines the morning of surgery with A SIP OF WATER HYDROcodone-acetaminophen (NORCO/VICODIN if needed  7 days prior to surgery STOP taking any Aspirin, Aleve, Naproxen, Ibuprofen, Motrin, Advil, Goody's, BC's, all herbal medications, fish oil, and all vitamins    Do not wear jewelry, make-up or nail polish.  Do not wear lotions, powders, or perfumes, or deoderant.  Do not shave 48 hours prior to surgery.   Do not bring valuables to the hospital.  [facility] is not responsible for any belongings or valuables.  Contacts, dentures or bridgework may not be worn into surgery.  Leave your suitcase in the car.  After surgery it may be brought to your room.  For patients admitted to the hospital, discharge time will be determined by your treatment team.  Patients discharged the day of surgery will not be allowed to drive home.    Special instructions:   [facility]- Preparing For Surgery  Before surgery, you can play an important role. Because skin is not sterile, your skin needs to be as free of germs as possible. You can reduce the number of germs on your skin by washing with CHG (chlorahexidine gluconate) Soap before surgery.  CHG is an antiseptic cleaner which kills germs and bonds with the skin to continue killing germs even after washing.  Please do not use if you have an allergy to CHG or antibacterial soaps. If your skin becomes reddened/irritated stop using the CHG.  Do not shave (including legs and underarms) for at least  48 hours prior to first CHG shower. It is OK to shave your face.  Please follow these instructions carefully.   1. Shower the NIGHT BEFORE SURGERY and the MORNING OF SURGERY with CHG.   2. If you chose to wash your hair, wash your hair first as usual with your normal shampoo.  3. After you shampoo, rinse your hair and body thoroughly to remove the shampoo.  4. Use CHG as you would any other liquid soap. You can apply CHG directly to the skin and wash gently with a scrungie or a clean washcloth.   5. Apply the CHG Soap to your body ONLY FROM THE NECK DOWN.  Do not use on open wounds or open sores. Avoid contact with your eyes, ears, mouth and genitals (private parts). Wash genitals (private parts) with your normal soap.  6. Wash thoroughly, paying special attention to the area where your surgery will be performed.  7. Thoroughly rinse your body with warm water from the neck down.  8. DO NOT shower/wash with your normal soap after using and rinsing off the CHG Soap.  9. Pat yourself dry with a CLEAN TOWEL.   10. Wear CLEAN PAJAMAS   11. Place CLEAN SHEETS on your bed the night of your first shower and DO NOT SLEEP WITH PETS.    Day of Surgery: Do not apply any deodorants/lotions. Please wear clean clothes to the hospital/surgery center.      Please read over  the following fact sheets that you were given. Pain Booklet, Coughing and Deep Breathing and Surgical Site Infection Prevention

## 2016-01-05 ENCOUNTER — Encounter (HOSPITAL_COMMUNITY)
Admission: RE | Admit: 2016-01-05 | Discharge: 2016-01-05 | Disposition: A | Discharge status: Home / Self Care | Payer: BLUE CROSS/BLUE SHIELD | Source: Ambulatory Visit | Attending: General Surgery | Admitting: General Surgery

## 2016-01-05 ENCOUNTER — Encounter (HOSPITAL_COMMUNITY): Payer: Self-pay

## 2016-01-05 DIAGNOSIS — Z01812 Encounter for preprocedural laboratory examination: Secondary | ICD-10-CM | POA: Insufficient documentation

## 2016-01-05 DIAGNOSIS — K8020 Calculus of gallbladder without cholecystitis without obstruction: Secondary | ICD-10-CM | POA: Diagnosis not present

## 2016-01-05 HISTORY — DX: Personal history of other diseases of the respiratory system: Z87.09

## 2016-01-05 HISTORY — DX: Personal history of other diseases of the circulatory system: Z86.79

## 2016-01-05 LAB — CBC
HCT: 33.9 % — ABNORMAL LOW (ref 36.0–46.0)
Hemoglobin: 10.4 g/dL — ABNORMAL LOW (ref 12.0–15.0)
MCH: 22.3 pg — AB (ref 26.0–34.0)
MCHC: 30.7 g/dL (ref 30.0–36.0)
MCV: 72.6 fL — AB (ref 78.0–100.0)
PLATELETS: 332 10*3/uL (ref 150–400)
RBC: 4.67 MIL/uL (ref 3.87–5.11)
RDW: 16.7 % — AB (ref 11.5–15.5)
WBC: 7.3 10*3/uL (ref 4.0–10.5)

## 2016-01-05 LAB — BASIC METABOLIC PANEL
ANION GAP: 7 (ref 5–15)
BUN: 10 mg/dL (ref 6–20)
CALCIUM: 9.1 mg/dL (ref 8.9–10.3)
CO2: 24 mmol/L (ref 22–32)
CREATININE: 0.73 mg/dL (ref 0.44–1.00)
Chloride: 105 mmol/L (ref 101–111)
GFR calc Af Amer: >60 mL/min (ref >60)
GLUCOSE: 92 mg/dL (ref 65–99)
Potassium: 3.7 mmol/L (ref 3.5–5.1)
Sodium: 136 mmol/L (ref 135–145)

## 2016-01-05 LAB — HCG, SERUM, QUALITATIVE: Preg, Serum: NEGATIVE

## 2016-01-05 NOTE — Progress Notes (Signed)
PCP - patient denies having a PCP Cardiologist - denies  EKG/CXR - denies Echo/stress test/cardiac cath - denies  Patient denies chest pain and shortness of breath at PAT appointment.

## 2016-01-06 LAB — HEMOGLOBIN A1C
Hgb A1c MFr Bld: 5.5 % (ref 4.8–5.6)
Mean Plasma Glucose: 111 mg/dL

## 2016-01-14 ENCOUNTER — Encounter (HOSPITAL_COMMUNITY)
Admission: RE | Disposition: A | Discharge status: Home / Self Care | Payer: Self-pay | Source: Ambulatory Visit | Attending: General Surgery

## 2016-01-14 ENCOUNTER — Ambulatory Visit (HOSPITAL_COMMUNITY): Payer: BLUE CROSS/BLUE SHIELD | Admitting: Anesthesiology

## 2016-01-14 ENCOUNTER — Ambulatory Visit (HOSPITAL_COMMUNITY)
Admission: RE | Admit: 2016-01-14 | Discharge: 2016-01-14 | Disposition: A | Discharge status: Home / Self Care | Payer: BLUE CROSS/BLUE SHIELD | Source: Ambulatory Visit | Attending: General Surgery | Admitting: General Surgery

## 2016-01-14 DIAGNOSIS — K8010 Calculus of gallbladder with chronic cholecystitis without obstruction: Secondary | ICD-10-CM | POA: Diagnosis present

## 2016-01-14 DIAGNOSIS — Z886 Allergy status to analgesic agent status: Secondary | ICD-10-CM | POA: Insufficient documentation

## 2016-01-14 HISTORY — PX: CHOLECYSTECTOMY: SHX55

## 2016-01-14 SURGERY — LAPAROSCOPIC CHOLECYSTECTOMY
Anesthesia: General | Site: Abdomen

## 2016-01-14 MED ORDER — FENTANYL CITRATE (PF) 100 MCG/2ML IJ SOLN
INTRAMUSCULAR | Status: DC | PRN
  Administered 2016-01-14 (×2): 100 ug via INTRAVENOUS

## 2016-01-14 MED ORDER — BUPIVACAINE HCL 0.25 % IJ SOLN
INTRAMUSCULAR | Status: DC | PRN
  Administered 2016-01-14: 7 mL

## 2016-01-14 MED ORDER — GLYCOPYRROLATE 0.2 MG/ML IV SOSY
PREFILLED_SYRINGE | INTRAVENOUS | Status: AC
  Filled 2016-01-14: qty 3

## 2016-01-14 MED ORDER — ONDANSETRON HCL 4 MG/2ML IJ SOLN
INTRAMUSCULAR | Status: DC | PRN
  Administered 2016-01-14: 4 mg via INTRAVENOUS

## 2016-01-14 MED ORDER — ROCURONIUM BROMIDE 10 MG/ML (PF) SYRINGE
PREFILLED_SYRINGE | INTRAVENOUS | Status: AC
  Filled 2016-01-14: qty 20

## 2016-01-14 MED ORDER — FENTANYL CITRATE (PF) 100 MCG/2ML IJ SOLN
25.0000 ug | INTRAMUSCULAR | Status: DC | PRN
  Administered 2016-01-14 (×3): 50 ug via INTRAVENOUS

## 2016-01-14 MED ORDER — HYDROMORPHONE HCL 2 MG/ML IJ SOLN
INTRAMUSCULAR | Status: AC
  Filled 2016-01-14: qty 1

## 2016-01-14 MED ORDER — ONDANSETRON HCL 4 MG/2ML IJ SOLN
INTRAMUSCULAR | Status: AC
  Filled 2016-01-14: qty 2

## 2016-01-14 MED ORDER — LIDOCAINE 2% (20 MG/ML) 5 ML SYRINGE
INTRAMUSCULAR | Status: AC
  Filled 2016-01-14: qty 15

## 2016-01-14 MED ORDER — LACTATED RINGERS IV SOLN: INTRAVENOUS | Status: DC

## 2016-01-14 MED ORDER — PROPOFOL 10 MG/ML IV BOLUS
INTRAVENOUS | Status: DC | PRN
  Administered 2016-01-14: 200 mg via INTRAVENOUS

## 2016-01-14 MED ORDER — SUGAMMADEX SODIUM 200 MG/2ML IV SOLN
INTRAVENOUS | Status: DC | PRN
  Administered 2016-01-14: 200 mg via INTRAVENOUS

## 2016-01-14 MED ORDER — BUPIVACAINE HCL (PF) 0.25 % IJ SOLN
INTRAMUSCULAR | Status: AC
  Filled 2016-01-14: qty 30

## 2016-01-14 MED ORDER — HYDROMORPHONE HCL 1 MG/ML IJ SOLN
0.5000 mg | INTRAMUSCULAR | Status: AC | PRN
  Administered 2016-01-14 (×4): 0.5 mg via INTRAVENOUS

## 2016-01-14 MED ORDER — OXYCODONE-ACETAMINOPHEN 5-325 MG PO TABS: 1.0000 | ORAL_TABLET | ORAL | 0 refills | Status: DC | PRN

## 2016-01-14 MED ORDER — MIDAZOLAM HCL 5 MG/5ML IJ SOLN
INTRAMUSCULAR | Status: DC | PRN
  Administered 2016-01-14: 2 mg via INTRAVENOUS

## 2016-01-14 MED ORDER — NEOSTIGMINE METHYLSULFATE 5 MG/5ML IV SOSY
PREFILLED_SYRINGE | INTRAVENOUS | Status: AC
  Filled 2016-01-14: qty 5

## 2016-01-14 MED ORDER — SUGAMMADEX SODIUM 200 MG/2ML IV SOLN
INTRAVENOUS | Status: AC
  Filled 2016-01-14: qty 2

## 2016-01-14 MED ORDER — FENTANYL CITRATE (PF) 100 MCG/2ML IJ SOLN
INTRAMUSCULAR | Status: AC
  Administered 2016-01-14: 50 ug via INTRAVENOUS
  Filled 2016-01-14: qty 2

## 2016-01-14 MED ORDER — FENTANYL CITRATE (PF) 100 MCG/2ML IJ SOLN
INTRAMUSCULAR | Status: AC
  Filled 2016-01-14: qty 4

## 2016-01-14 MED ORDER — OXYCODONE HCL 5 MG PO TABS
5.0000 mg | ORAL_TABLET | ORAL | Status: DC | PRN
  Administered 2016-01-14: 10 mg via ORAL
  Filled 2016-01-14: qty 2

## 2016-01-14 MED ORDER — OXYCODONE HCL 5 MG PO TABS
ORAL_TABLET | ORAL | Status: AC
  Filled 2016-01-14: qty 2

## 2016-01-14 MED ORDER — CEFAZOLIN SODIUM-DEXTROSE 2-4 GM/100ML-% IV SOLN
INTRAVENOUS | Status: AC
  Filled 2016-01-14: qty 100

## 2016-01-14 MED ORDER — FENTANYL CITRATE (PF) 100 MCG/2ML IJ SOLN
INTRAMUSCULAR | Status: AC
  Filled 2016-01-14: qty 2

## 2016-01-14 MED ORDER — MIDAZOLAM HCL 2 MG/2ML IJ SOLN
INTRAMUSCULAR | Status: AC
  Filled 2016-01-14: qty 2

## 2016-01-14 MED ORDER — LIDOCAINE HCL (CARDIAC) 20 MG/ML IV SOLN
INTRAVENOUS | Status: DC | PRN
  Administered 2016-01-14: 100 mg via INTRATRACHEAL

## 2016-01-14 MED ORDER — MEPERIDINE HCL 25 MG/ML IJ SOLN: 6.2500 mg | INTRAMUSCULAR | Status: DC | PRN

## 2016-01-14 MED ORDER — LACTATED RINGERS IV SOLN
INTRAVENOUS | Status: DC
  Administered 2016-01-14: 13:00:00 via INTRAVENOUS
  Administered 2016-01-14: 50 mL/h via INTRAVENOUS

## 2016-01-14 MED ORDER — SODIUM CHLORIDE 0.9 % IR SOLN
Status: DC | PRN
  Administered 2016-01-14: 1000 mL

## 2016-01-14 MED ORDER — METOCLOPRAMIDE HCL 5 MG/ML IJ SOLN: 10.0000 mg | Freq: Once | INTRAMUSCULAR | Status: DC | PRN

## 2016-01-14 MED ORDER — 0.9 % SODIUM CHLORIDE (POUR BTL) OPTIME
TOPICAL | Status: DC | PRN
  Administered 2016-01-14: 1000 mL

## 2016-01-14 MED ORDER — ROCURONIUM BROMIDE 100 MG/10ML IV SOLN
INTRAVENOUS | Status: DC | PRN
  Administered 2016-01-14: 50 mg via INTRAVENOUS

## 2016-01-14 SURGICAL SUPPLY — 37 items
BENZOIN TINCTURE PRP APPL 2/3 (GAUZE/BANDAGES/DRESSINGS) ×3 IMPLANT
CANISTER SUCTION 2500CC (MISCELLANEOUS) ×3 IMPLANT
CHLORAPREP W/TINT 26ML (MISCELLANEOUS) ×3 IMPLANT
CLIP LIGATING HEMO O LOK GREEN (MISCELLANEOUS) ×3 IMPLANT
CLOSURE WOUND 1/2 X4 (GAUZE/BANDAGES/DRESSINGS) ×1
COVER SURGICAL LIGHT HANDLE (MISCELLANEOUS) ×3 IMPLANT
COVER TRANSDUCER ULTRASND (DRAPES) ×3 IMPLANT
DEVICE TROCAR PUNCTURE CLOSURE (ENDOMECHANICALS) ×3 IMPLANT
ELECT REM PT RETURN 9FT ADLT (ELECTROSURGICAL) ×3
ELECTRODE REM PT RTRN 9FT ADLT (ELECTROSURGICAL) ×1 IMPLANT
GAUZE SPONGE 2X2 8PLY STRL LF (GAUZE/BANDAGES/DRESSINGS) ×1 IMPLANT
GLOVE BIO SURGEON STRL SZ7.5 (GLOVE) ×3 IMPLANT
GOWN STRL REUS W/ TWL LRG LVL3 (GOWN DISPOSABLE) ×2 IMPLANT
GOWN STRL REUS W/ TWL XL LVL3 (GOWN DISPOSABLE) ×1 IMPLANT
GOWN STRL REUS W/TWL LRG LVL3 (GOWN DISPOSABLE) ×4
GOWN STRL REUS W/TWL XL LVL3 (GOWN DISPOSABLE) ×2
KIT BASIN OR (CUSTOM PROCEDURE TRAY) ×3 IMPLANT
KIT ROOM TURNOVER OR (KITS) ×3 IMPLANT
NEEDLE INSUFFLATION 14GA 120MM (NEEDLE) ×3 IMPLANT
NS IRRIG 1000ML POUR BTL (IV SOLUTION) ×3 IMPLANT
PAD ARMBOARD 7.5X6 YLW CONV (MISCELLANEOUS) ×6 IMPLANT
POUCH RETRIEVAL ECOSAC 10 (ENDOMECHANICALS) IMPLANT
POUCH RETRIEVAL ECOSAC 10MM (ENDOMECHANICALS)
SCISSORS LAP 5X35 DISP (ENDOMECHANICALS) ×3 IMPLANT
SET IRRIG TUBING LAPAROSCOPIC (IRRIGATION / IRRIGATOR) ×3 IMPLANT
SLEEVE ENDOPATH XCEL 5M (ENDOMECHANICALS) ×3 IMPLANT
SPECIMEN JAR SMALL (MISCELLANEOUS) ×3 IMPLANT
SPONGE GAUZE 2X2 STER 10/PKG (GAUZE/BANDAGES/DRESSINGS) ×2
STRIP CLOSURE SKIN 1/2X4 (GAUZE/BANDAGES/DRESSINGS) ×2 IMPLANT
SUT MNCRL AB 3-0 PS2 18 (SUTURE) ×3 IMPLANT
TAPE CLOTH SURG 4X10 WHT LF (GAUZE/BANDAGES/DRESSINGS) ×3 IMPLANT
TOWEL OR 17X24 6PK STRL BLUE (TOWEL DISPOSABLE) ×3 IMPLANT
TOWEL OR 17X26 10 PK STRL BLUE (TOWEL DISPOSABLE) ×3 IMPLANT
TRAY LAPAROSCOPIC MC (CUSTOM PROCEDURE TRAY) ×3 IMPLANT
TROCAR XCEL NON-BLD 11X100MML (ENDOMECHANICALS) ×3 IMPLANT
TROCAR XCEL NON-BLD 5MMX100MML (ENDOMECHANICALS) ×3 IMPLANT
TUBING INSUFFLATION (TUBING) ×3 IMPLANT

## 2016-01-14 NOTE — Anesthesia Procedure Notes (Signed)
Procedure Name: Intubation Date/Time: 01/14/2016 11:46 AM Performed by: Marena ChancyBECKNER, Ryhanna Dunsmore S Pre-anesthesia Checklist: Patient identified, Emergency Drugs available, Suction available and Patient being monitored Patient Re-evaluated:Patient Re-evaluated prior to inductionOxygen Delivery Method: Circle System Utilized Preoxygenation: Pre-oxygenation with 100% oxygen Intubation Type: IV induction Ventilation: Mask ventilation without difficulty Laryngoscope Size: Miller and 2 Grade View: Grade I Tube type: Oral Number of attempts: 1 Airway Equipment and Method: Stylet and Oral airway Placement Confirmation: ETT inserted through vocal cords under direct vision,  positive ETCO2 and breath sounds checked- equal and bilateral Tube secured with: Tape Dental Injury: Teeth and Oropharynx as per pre-operative assessment

## 2016-01-14 NOTE — Anesthesia Postprocedure Evaluation (Signed)
Anesthesia Post Note  Patient: Brandy Bradley  Procedure(s) Performed: Procedure(s) (LRB): LAPAROSCOPIC CHOLECYSTECTOMY (N/A)  Patient location during evaluation: PACU Anesthesia Type: General Level of consciousness: awake and alert Pain management: pain level controlled Vital Signs Assessment: post-procedure vital signs reviewed and stable Respiratory status: spontaneous breathing, nonlabored ventilation, respiratory function stable and patient connected to nasal cannula oxygen Cardiovascular status: blood pressure returned to baseline and stable Postop Assessment: no signs of nausea or vomiting Anesthetic complications: no    Last Vitals:  Vitals:   01/14/16 1322 01/14/16 1330  BP: 119/79   Pulse: 67 73  Resp: (!) 8 10  Temp:      Last Pain:  Vitals:   01/14/16 1356  TempSrc:   PainSc: 7                  Phillips Groutarignan, Archie Atilano

## 2016-01-14 NOTE — Discharge Instructions (Signed)
[facility] ______[facility] °LAPAROSCOPIC SURGERY: POST OP INSTRUCTIONS °Always review your discharge instruction sheet given to you by the facility where your surgery was performed. °IF YOU HAVE DISABILITY OR FAMILY LEAVE FORMS, YOU MUST BRING THEM TO THE OFFICE FOR PROCESSING.   °DO NOT GIVE THEM TO YOUR DOCTOR. ° °1. A prescription for pain medication may be given to you upon discharge.  Take your pain medication as prescribed, if needed.  If narcotic pain medicine is not needed, then you may take acetaminophen (Tylenol) or ibuprofen (Advil) as needed. °2. Take your usually prescribed medications unless otherwise directed. °3. If you need a refill on your pain medication, please contact your pharmacy.  They will contact our office to request authorization. Prescriptions will not be filled after 5pm or on week-ends. °4. You should follow a light diet the first few days after arrival home, such as soup and crackers, etc.  Be sure to include lots of fluids daily. °5. Most patients will experience some swelling and bruising in the area of the incisions.  Ice packs will help.  Swelling and bruising can take several days to resolve.  °6. It is common to experience some constipation if taking pain medication after surgery.  Increasing fluid intake and taking a stool softener (such as Colace) will usually help or prevent this problem from occurring.  A mild laxative (Milk of Magnesia or Miralax) should be taken according to package instructions if there are no bowel movements after 48 hours. °7. Unless discharge instructions indicate otherwise, you may remove your bandages 24-48 hours after surgery, and you may shower at that time.  You may have steri-strips (small skin tapes) in place directly over the incision.  These strips should be left on the skin for 7-10 days.  If your surgeon used skin glue on the incision, you may shower in 24 hours.  The glue will flake off over the next 2-3 weeks.  Any sutures or  staples will be removed at the office during your follow-up visit. °8. ACTIVITIES:  You may resume regular (light) daily activities beginning the next day--such as daily self-care, walking, climbing stairs--gradually increasing activities as tolerated.  You may have sexual intercourse when it is comfortable.  Refrain from any heavy lifting or straining until approved by your doctor. °a. You may drive when you are no longer taking prescription pain medication, you can comfortably wear a seatbelt, and you can safely maneuver your car and apply brakes. °b. RETURN TO WORK:  __________________________________________________________ °9. You should see your doctor in the office for a follow-up appointment approximately 2-3 weeks after your surgery.  Make sure that you call for this appointment within a day or two after you arrive home to insure a convenient appointment time. °10. OTHER INSTRUCTIONS: __________________________________________________________________________________________________________________________ __________________________________________________________________________________________________________________________ °WHEN TO CALL YOUR DOCTOR: °1. Fever over 101.0 °2. Inability to urinate °3. Continued bleeding from incision. °4. Increased pain, redness, or drainage from the incision. °5. Increasing abdominal pain ° °The clinic staff is available to answer your questions during regular business hours.  Please don’t hesitate to call and ask to speak to one of the nurses for clinical concerns.  If you have a medical emergency, go to the nearest emergency room or call 911.  A surgeon from [facility] is always on call at the hospital. °[address], [address], [place], [place]  [address] ? [address] [address], [place], [place]   [address] °[phone] ? [phone] ? FAX [phone] °Web site:   [email or web address] °

## 2016-01-14 NOTE — Anesthesia Preprocedure Evaluation (Signed)
Anesthesia Evaluation  Patient identified by MRN, date of birth, ID band Patient awake    Reviewed: Allergy & Precautions, NPO status , Patient's Chart, lab work & pertinent test results  Airway Mallampati: II  TM Distance: >3 FB Neck ROM: Full    Dental no notable dental hx.    Pulmonary neg pulmonary ROS,    Pulmonary exam normal breath sounds clear to auscultation       Cardiovascular negative cardio ROS Normal cardiovascular exam Rhythm:Regular Rate:Normal     Neuro/Psych negative neurological ROS  negative psych ROS   GI/Hepatic negative GI ROS, Neg liver ROS,   Endo/Other  negative endocrine ROS  Renal/GU negative Renal ROS  negative genitourinary   Musculoskeletal negative musculoskeletal ROS (+)   Abdominal   Peds negative pediatric ROS (+)  Hematology negative hematology ROS (+)   Anesthesia Other Findings   Reproductive/Obstetrics negative OB ROS                             Anesthesia Physical Anesthesia Plan  ASA: II  Anesthesia Plan: General   Post-op Pain Management:    Induction: Intravenous  Airway Management Planned:   Additional Equipment:   Intra-op Plan:   Post-operative Plan: Extubation in OR  Informed Consent: I have reviewed the patients History and Physical, chart, labs and discussed the procedure including the risks, benefits and alternatives for the proposed anesthesia with the patient or authorized representative who has indicated his/her understanding and acceptance.   Dental advisory given  Plan Discussed with: CRNA  Anesthesia Plan Comments:         Anesthesia Quick Evaluation  

## 2016-01-14 NOTE — Transfer of Care (Signed)
Immediate Anesthesia Transfer of Care Note  Patient: Brandy Bradley  Procedure(s) Performed: Procedure(s): LAPAROSCOPIC CHOLECYSTECTOMY (N/A)  Patient Location: PACU  Anesthesia Type:General  Level of Consciousness: awake, alert  and oriented  Airway & Oxygen Therapy: Patient Spontanous Breathing and Patient connected to nasal cannula oxygen  Post-op Assessment: Report given to RN, Post -op Vital signs reviewed and stable and Patient moving all extremities X 4  Post vital signs: Reviewed and stable  Last Vitals:  Vitals:   01/14/16 1005 01/14/16 1252  BP: 129/73   Pulse: 82   Resp: 20   Temp: 37.1 C (P) 36.4 C    Last Pain:  Vitals:   01/14/16 1005  TempSrc: Oral      Patients Stated Pain Goal: 3 (01/14/16 1005)  Complications: No apparent anesthesia complications

## 2016-01-14 NOTE — H&P (Signed)
The patient is a 25 year old female who presents for evaluation of gall stones. The patient is a 25 year old female who [facility]. Patient states that she's had 2 episodes of epigastric abdominal pain. These both sensitive to the ER. Patient underwent ultrasound revealed a 2 cm gallstone. Patient states that the pain started after eating high fatty meal.  Patient states that she has no other relieving factors.  Patient has no other past medical history or surgical history. She is allergic to ibuprofen.   Other Problems Cholelithiasis  Past Surgical History  No pertinent past surgical history  Diagnostic Studies History ( Colonoscopy never Mammogram 1-3 years ago Pap Smear 1-5 years ago  Allergies Ibuprofen *ANALGESICS - ANTI-INFLAMMATORY*  Medication History  Xyzal (5MG  Tablet, Oral) Active. Medications Reconciled  Social History  Caffeine use Coffee. No alcohol use No drug use Tobacco use Never smoker.  Family History  First Degree Relatives No pertinent family history  Pregnancy / Birth History  Age at menarche 12 years. Contraceptive History Intrauterine device. Gravida 1 Length (months) of breastfeeding 3-6 Maternal age 321-25 Para 1 Regular periods    Review of Systems  General Not Present- Appetite Loss, Chills, Fatigue, Fever, Night Sweats, Weight Gain and Weight Loss. Skin Present- Dryness and Rash. Not Present- Change in Wart/Mole, Hives, Jaundice, New Lesions, Non-Healing Wounds and Ulcer. HEENT Not Present- Earache, Hearing Loss, Hoarseness, Nose Bleed, Oral Ulcers, Ringing in the Ears, Seasonal Allergies, Sinus Pain, Sore Throat, Visual Disturbances, Wears glasses/contact lenses and Yellow Eyes. Respiratory Not Present- Bloody sputum, Chronic Cough, Difficulty Breathing, Snoring and Wheezing. Breast Not Present- Breast Mass, Breast Pain, Nipple Discharge  and Skin Changes. Cardiovascular Not Present- Chest Pain, Difficulty Breathing Lying Down, Leg Cramps, Palpitations, Rapid Heart Rate, Shortness of Breath and Swelling of Extremities. Gastrointestinal Present- Abdominal Pain, Bloating and Nausea. Not Present- Bloody Stool, Change in Bowel Habits, Chronic diarrhea, Constipation, Difficulty Swallowing, Excessive gas, Gets full quickly at meals, Hemorrhoids, Indigestion, Rectal Pain and Vomiting. Female Genitourinary Not Present- Frequency, Nocturia, Painful Urination, Pelvic Pain and Urgency. Musculoskeletal Not Present- Back Pain, Joint Pain, Joint Stiffness, Muscle Pain, Muscle Weakness and Swelling of Extremities. Neurological Not Present- Decreased Memory, Fainting, Headaches, Numbness, Seizures, Tingling, Tremor, Trouble walking and Weakness. Psychiatric Not Present- Anxiety, Bipolar, Change in Sleep Pattern, Depression, Fearful and Frequent crying. Endocrine Not Present- Cold Intolerance, Excessive Hunger, Hair Changes, Heat Intolerance, Hot flashes and New Diabetes. Hematology Not Present- Blood Thinners, Easy Bruising, Excessive bleeding, Gland problems, HIV and Persistent Infections.  BP 129/73   Pulse 82   Temp 98.8 F (37.1 C) (Oral)   Resp 20   Ht 5\' 4"  (1.626 m)   Wt 75.3 kg (166 lb)   LMP 12/15/2015 (Approximate)   SpO2 99%   BMI 28.49 kg/m     Physical Exam  General Mental Status-Alert. General Appearance-Consistent with stated age. Hydration-Well hydrated. Voice-Normal.  Head and Neck Head-normocephalic, atraumatic with no lesions or palpable masses.  Eye Eyeball - Bilateral-Extraocular movements intact. Sclera/Conjunctiva - Bilateral-No scleral icterus.  Chest and Lung Exam Chest and lung exam reveals -quiet, even and easy respiratory effort with no use of accessory muscles. Inspection Chest Wall - Normal. Back - normal.  Cardiovascular Cardiovascular examination reveals -normal  heart sounds, regular rate and rhythm with no murmurs.  Abdomen Inspection Normal Exam - No Hernias. Palpation/Percussion Normal exam - Soft, Non Tender, No Rebound tenderness, No Rigidity (guarding) and No hepatosplenomegaly. Auscultation Normal exam -  Bowel sounds normal.  Neurologic Neurologic evaluation reveals -alert and oriented x 3 with no impairment of recent or remote memory. Mental Status-Normal.  Musculoskeletal Normal Exam - Left-Upper Extremity Strength Normal and Lower Extremity Strength Normal. Normal Exam - Right-Upper Extremity Strength Normal, Lower Extremity Weakness.    Assessment & Plan  SYMPTOMATIC CHOLELITHIASIS (K80.20) Impression: 25 year old female with cholelithiasis  1. We will proceed to the operating room for a laparoscopic cholecystectomy 2. Risks and benefits were discussed with the patient to generally include, but not limited to: infection, bleeding, possible need for post op ERCP, damage to the bile ducts, bile leak, and possible need for further surgery. Alternatives were offered and described. All questions were answered and the patient voiced understanding of the procedure and wishes to proceed at this point with a laparoscopic cholecystectomy

## 2016-01-14 NOTE — Op Note (Signed)
01/14/2016  12:33 PM  PATIENT:  Brandy Bradley  25 y.o. female  PRE-OPERATIVE DIAGNOSIS:  GALLSTONES  POST-OPERATIVE DIAGNOSIS:  CHRONIC CHOLECYSTITIS, GALLSTONES  PROCEDURE:  Procedure(s): LAPAROSCOPIC CHOLECYSTECTOMY (N/A)  SURGEON:  Surgeon(s) and Role:    * Axel FillerArmando Angalena Cousineau, MD - Primary  ANESTHESIA:   local and general  EBL:  5cc  BLOOD ADMINISTERED:none  DRAINS: none   LOCAL MEDICATIONS USED:  BUPIVICAINE   SPECIMEN:  Source of Specimen:  gallbladder  DISPOSITION OF SPECIMEN:  PATHOLOGY  COUNTS:  YES  TOURNIQUET:  * No tourniquets in log *  DICTATION: .Dragon Dictation The patient was taken to the operating and placed in the supine position with bilateral SCDs in place. The patient was prepped and draped in the usual sterile fashion. A time out was called and all facts were verified. A pneumoperitoneum was obtained via A Veress needle technique to a pressure of 14mm of mercury.  A 5mm trochar was then placed in the right upper quadrant under visualization, and there were no injuries to any abdominal organs. A 11 mm port was then placed in the umbilical region after infiltrating with local anesthesia under direct visualization. A second and third epigastric port and right lower quadrant port placement under direct visualization, respectively.  The gallbladder was identified and retracted, the peritoneum was then sharply dissected from the gallbladder and this dissection was carried down to Calot's triangle. The gallbladder was identified and stripped away circumferentially and seen going into the gallbladder 360, the critical angle was obtained.  2 clips were placed proximally one distally and the cystic duct transected. The cystic artery was identified and 2 clips placed proximally and one distally and transected. We then proceeded to remove the gallbladder off the hepatic fossa with Bovie cautery. A retrieval bag was then placed in the abdomen and gallbladder  placed in the bag. The hepatic fossa was then reexamined and hemostasis was achieved with Bovie cautery and was excellent at the end of the case. The subhepatic fossa and perihepatic fossa was then irrigated until the effluent was clear.  The gallbladder and bag were removed from the abdominal cavity. The 11 mm trocar fascia was reapproximated with the Endo Close #1 Vicryl x2 . The pneumoperitoneum was evacuated and all trochars removed under direct visulalization. The skin was then closed with 4-0 Monocryl and the skin dressed with Steri-Strips, gauze, and tape. The patient was awaken from general anesthesia and taken to the recovery room in stable condition.   PLAN OF CARE: Discharge to home after PACU  PATIENT DISPOSITION:  PACU - hemodynamically stable.   Delay start of Pharmacological VTE agent (>24hrs) due to surgical blood loss or risk of bleeding: not applicable

## 2016-01-15 ENCOUNTER — Encounter (HOSPITAL_COMMUNITY): Payer: Self-pay | Admitting: General Surgery

## 2016-02-16 ENCOUNTER — Ambulatory Visit (INDEPENDENT_AMBULATORY_CARE_PROVIDER_SITE_OTHER): Payer: BLUE CROSS/BLUE SHIELD | Admitting: Family Medicine

## 2016-02-16 VITALS — BP 122/72 | HR 84 | Temp 98.5°F | Resp 17 | Ht 63.5 in | Wt 160.0 lb

## 2016-02-16 DIAGNOSIS — J069 Acute upper respiratory infection, unspecified: Secondary | ICD-10-CM

## 2016-02-16 MED ORDER — AZITHROMYCIN 250 MG PO TABS: ORAL_TABLET | ORAL | 0 refills | Status: DC

## 2016-02-16 NOTE — Progress Notes (Signed)
   Brandy Bradley is a 25 y.o. female who presents to [facility] today for URI symptoms:  1.  URI:  Patient complains of cough, runny nose, sore throat for about 1 week.  Not improving.  Daughter is sick contact.  Subjective chills.  No recorded temperatures.  Cough is dry and nonproductive, worse at night.  Hasn't tried anything for relief.     ROS as above.  Pertinently, no chest pain, palpitations, Abd pain, N/V/D.   PMH reviewed. Patient is a nonsmoker.    Past Medical History:  Diagnosis Date  . Anemia due to blood loss, acute 04/03/2013  . History of asthma    as a child  . History of hypertension    "due to Mirena but no problems since it was removed"  . Insulin resistance    Past Surgical History:  Procedure Laterality Date  . CHOLECYSTECTOMY N/A 01/14/2016   Procedure: LAPAROSCOPIC CHOLECYSTECTOMY;  Surgeon: Axel FillerArmando Ramirez, MD;  Location: [facility] OR;  Service: General;  Laterality: N/A;  . NO PAST SURGERIES      Medications reviewed. Current Outpatient Prescriptions  Medication Sig Dispense Refill  . FLUoxetine (PROZAC) 10 MG tablet Take 10 mg by mouth at bedtime.    Marland Kitchen. levocetirizine (XYZAL) 5 MG tablet Take 5 mg by mouth every evening.    Marland Kitchen. PARAGARD INTRAUTERINE COPPER IUD IUD 1 each by Intrauterine route once.    Marland Kitchen. azithromycin (ZITHROMAX) 250 MG tablet Take 2 tabs today then 1 tab po daily afterwards until gone. 6 tablet 0   No current facility-administered medications for this visit.      Physical Exam:  BP 122/72 (BP Location: Right Arm, Patient Position: Sitting, Cuff Size: Normal)   Pulse 84   Temp 98.5 F (36.9 C) (Oral)   Resp 17   Ht 5' 3.5" (1.613 m)   Wt 160 lb (72.6 kg)   LMP 02/15/2016 (Approximate)   SpO2 99%   BMI 27.90 kg/m  Gen:  Patient sitting on exam table, appears stated age in no acute distress Head: Normocephalic atraumatic Eyes: EOMI, PERRL, sclera and conjunctiva non-erythematous Ears:  Canals clear  bilaterally.  TMs pearly gray bilaterally without erythema or bulging.   Nose:  Nasal turbinates grossly enlarged bilaterally. Some exudates noted. Tender to palpation of maxillary sinus  Mouth: Mucosa membranes moist. Tonsils +2, nonenlarged, non-erythematous. Neck: No cervical lymphadenopathy noted Heart:  RRR, no murmurs auscultated. Pulm:  Clear to auscultation bilaterally with good air movement.  No wheezes or rales noted.     Assessment and Plan:  1.  URI/possible sinusitis: Plan to treat due to length of symptoms and no improvement.   Will prescribe azithromycin x 5 days. Instructed patient to return in 1 week for checkup or sooner if worsening or no improvement.

## 2016-02-16 NOTE — Patient Instructions (Addendum)
You have an upper respiratory infection.  Take the Azithromycin 2 tabs today then 1 tab daily afterwards until gone.    Use an over the counter nasal spray for relief.    If you're not feeling better by next week, come back and see us.  It was good to see you today!   Upper Respiratory Infection, Adult Most upper respiratory infections (URIs) are caused by a virus. A URI affects the nose, throat, and upper air passages. The most common type of URI is often called "the common cold." Follow these instructions at home:  Take medicines only as told by your doctor.  Gargle warm saltwater or take cough drops to comfort your throat as told by your doctor.  Use a warm mist humidifier or inhale steam from a shower to increase air moisture. This may make it easier to breathe.  Drink enough fluid to keep your pee (urine) clear or pale yellow.  Eat soups and other clear broths.  Have a healthy diet.  Rest as needed.  Go back to work when your fever is gone or your doctor says it is okay.  You may need to stay home longer to avoid giving your URI to others.  You can also wear a face mask and wash your hands often to prevent spread of the virus.  Use your inhaler more if you have asthma.  Do not use any tobacco products, including cigarettes, chewing tobacco, or electronic cigarettes. If you need help quitting, ask your doctor. Contact a doctor if:  You are getting worse, not better.  Your symptoms are not helped by medicine.  You have chills.  You are getting more short of breath.  You have brown or red mucus.  You have yellow or brown discharge from your nose.  You have pain in your face, especially when you bend forward.  You have a fever.  You have puffy (swollen) neck glands.  You have pain while swallowing.  You have white areas in the back of your throat. Get help right away if:  You have very bad or constant:  Headache.  Ear pain.  Pain in your forehead,  behind your eyes, and over your cheekbones (sinus pain).  Chest pain.  You have long-lasting (chronic) lung disease and any of the following:  Wheezing.  Long-lasting cough.  Coughing up blood.  A change in your usual mucus.  You have a stiff neck.  You have changes in your:  Vision.  Hearing.  Thinking.  Mood. This information is not intended to replace advice given to you by your health care provider. Make sure you discuss any questions you have with your health care provider. Document Released: 08/09/2007 Document Revised: 10/24/2015 Document Reviewed: 05/28/2013 Elsevier Interactive Patient Education  2017 [employer]      IF you received an x-ray today, you will receive an invoice from [facility]. Please contact [facility] at [phone] with questions or concerns regarding your invoice.   IF you received labwork today, you will receive an invoice from [facility]. Please contact [facility] at [phone] with questions or concerns regarding your invoice.   Our billing staff will not be able to assist you with questions regarding bills from these companies.  You will be contacted with the lab results as soon as they are available. The fastest way to get your results is to activate your My Chart account. Instructions are located on the last page of this paperwork. If you have not  heard from [place] regarding the results in 2 weeks, please contact this office.

## 2017-04-19 ENCOUNTER — Emergency Department (HOSPITAL_COMMUNITY): Payer: 59

## 2017-04-19 ENCOUNTER — Emergency Department (HOSPITAL_COMMUNITY)
Admission: EM | Admit: 2017-04-19 | Discharge: 2017-04-20 | Disposition: A | Discharge status: Home / Self Care | Payer: 59 | Attending: Emergency Medicine | Admitting: Emergency Medicine

## 2017-04-19 ENCOUNTER — Encounter (HOSPITAL_COMMUNITY): Payer: Self-pay | Admitting: Emergency Medicine

## 2017-04-19 DIAGNOSIS — Z5321 Procedure and treatment not carried out due to patient leaving prior to being seen by health care provider: Secondary | ICD-10-CM | POA: Diagnosis not present

## 2017-04-19 DIAGNOSIS — R6889 Other general symptoms and signs: Secondary | ICD-10-CM | POA: Diagnosis present

## 2017-04-19 LAB — CBC
HCT: 38.3 % (ref 36.0–46.0)
Hemoglobin: 11.7 g/dL — ABNORMAL LOW (ref 12.0–15.0)
MCH: 22.7 pg — AB (ref 26.0–34.0)
MCHC: 30.5 g/dL (ref 30.0–36.0)
MCV: 74.2 fL — AB (ref 78.0–100.0)
PLATELETS: 327 10*3/uL (ref 150–400)
RBC: 5.16 MIL/uL — ABNORMAL HIGH (ref 3.87–5.11)
RDW: 16.4 % — AB (ref 11.5–15.5)
WBC: 5.1 10*3/uL (ref 4.0–10.5)

## 2017-04-19 LAB — BASIC METABOLIC PANEL
Anion gap: 9 (ref 5–15)
BUN: 7 mg/dL (ref 6–20)
CALCIUM: 8.9 mg/dL (ref 8.9–10.3)
CHLORIDE: 105 mmol/L (ref 101–111)
CO2: 22 mmol/L (ref 22–32)
CREATININE: 0.65 mg/dL (ref 0.44–1.00)
GFR calc Af Amer: >60 mL/min (ref >60)
GFR calc non Af Amer: >60 mL/min (ref >60)
Glucose, Bld: 113 mg/dL — ABNORMAL HIGH (ref 65–99)
Potassium: 3.9 mmol/L (ref 3.5–5.1)
Sodium: 136 mmol/L (ref 135–145)

## 2017-04-19 LAB — I-STAT BETA HCG BLOOD, ED (MC, WL, AP ONLY): I-stat hCG, quantitative: <5 m[IU]/mL (ref <5)

## 2017-04-19 LAB — I-STAT TROPONIN, ED: TROPONIN I, POC: 0 ng/mL (ref 0.00–0.08)

## 2017-04-19 NOTE — ED Notes (Signed)
Pt stated that she did not want to wait any longer, she would go home and rest.  I advised her to come back if she began getting worse.

## 2017-04-19 NOTE — ED Triage Notes (Signed)
Pt presents to ED after being diagnosed with a "flu like virus" yesterday at a clinic.  Patient started Tamiflu yesterday.  States she is continuing to have symptoms, and they seem to be getting worse.  C/o congestion, generalized body ache, mucous "in my throat", nausea, diarrhea, and over mucous production.

## 2017-04-19 NOTE — ED Notes (Signed)
Pt states she is also having dizziness with standing and intermittent chest pressure.

## 2017-06-30 ENCOUNTER — Ambulatory Visit (INDEPENDENT_AMBULATORY_CARE_PROVIDER_SITE_OTHER): Payer: 59 | Admitting: Emergency Medicine

## 2017-06-30 ENCOUNTER — Encounter: Payer: Self-pay | Admitting: Emergency Medicine

## 2017-06-30 ENCOUNTER — Other Ambulatory Visit: Payer: Self-pay

## 2017-06-30 VITALS — BP 110/80 | HR 76 | Temp 98.7°F | Resp 16 | Ht 63.0 in | Wt 153.0 lb

## 2017-06-30 DIAGNOSIS — Z0000 Encounter for general adult medical examination without abnormal findings: Secondary | ICD-10-CM

## 2017-06-30 NOTE — Patient Instructions (Addendum)
   IF you received an x-ray today, you will receive an invoice from [facility]. Please contact [facility] at [phone] with questions or concerns regarding your invoice.   IF you received labwork today, you will receive an invoice from [facility]. Please contact [facility] at [phone] with questions or concerns regarding your invoice.   Our billing staff will not be able to assist you with questions regarding bills from these companies.  You will be contacted with the lab results as soon as they are available. The fastest way to get your results is to activate your My Chart account. Instructions are located on the last page of this paperwork. If you have not heard from us regarding the results in 2 weeks, please contact this office.    Health Maintenance, Female Adopting a healthy lifestyle and getting preventive care can go a long way to promote health and wellness. Talk with your health care provider about what schedule of regular examinations is right for you. This is a good chance for you to check in with your provider about disease prevention and staying healthy. In between checkups, there are plenty of things you can do on your own. Experts have done a lot of research about which lifestyle changes and preventive measures are most likely to keep you healthy. Ask your health care provider for more information. Weight and diet Eat a healthy diet  Be sure to include plenty of vegetables, fruits, low-fat dairy products, and lean protein.  Do not eat a lot of foods high in solid fats, added sugars, or salt.  Get regular exercise. This is one of the most important things you can do for your health. ? Most adults should exercise for at least 150 minutes each week. The exercise should increase your heart rate and make you sweat (moderate-intensity exercise). ? Most adults should also do strengthening exercises at least twice a week. This is in addition to the  moderate-intensity exercise.  Maintain a healthy weight  Body mass index (BMI) is a measurement that can be used to identify possible weight problems. It estimates body fat based on height and weight. Your health care provider can help determine your BMI and help you achieve or maintain a healthy weight.  For females 20 years of age and older: ? A BMI below 18.5 is considered underweight. ? A BMI of 18.5 to 24.9 is normal. ? A BMI of 25 to 29.9 is considered overweight. ? A BMI of 30 and above is considered obese.  Watch levels of cholesterol and blood lipids  You should start having your blood tested for lipids and cholesterol at 27 years of age, then have this test every 5 years.  You may need to have your cholesterol levels checked more often if: ? Your lipid or cholesterol levels are high. ? You are older than 27 years of age. ? You are at high risk for heart disease.  Cancer screening Lung Cancer  Lung cancer screening is recommended for adults 55-80 years old who are at high risk for lung cancer because of a history of smoking.  A yearly low-dose CT scan of the lungs is recommended for people who: ? Currently smoke. ? Have quit within the past 15 years. ? Have at least a 30-pack-year history of smoking. A pack year is smoking an average of one pack of cigarettes a day for 1 year.  Yearly screening should continue until it has been 15 years since you quit.  Yearly screening   should stop if you develop a health problem that would prevent you from having lung cancer treatment.  Breast Cancer  Practice breast self-awareness. This means understanding how your breasts normally appear and feel.  It also means doing regular breast self-exams. Let your health care provider know about any changes, no matter how small.  If you are in your 20s or 30s, you should have a clinical breast exam (CBE) by a health care provider every 1-3 years as part of a regular health exam.  If you  are 42 or older, have a CBE every year. Also consider having a breast X-ray (mammogram) every year.  If you have a family history of breast cancer, talk to your health care provider about genetic screening.  If you are at high risk for breast cancer, talk to your health care provider about having an MRI and a mammogram every year.  Breast cancer gene (BRCA) assessment is recommended for women who have family members with BRCA-related cancers. BRCA-related cancers include: ? Breast. ? Ovarian. ? Tubal. ? Peritoneal cancers.  Results of the assessment will determine the need for genetic counseling and BRCA1 and BRCA2 testing.  Cervical Cancer Your health care provider may recommend that you be screened regularly for cancer of the pelvic organs (ovaries, uterus, and vagina). This screening involves a pelvic examination, including checking for microscopic changes to the surface of your cervix (Pap test). You may be encouraged to have this screening done every 3 years, beginning at age 56.  For women ages 41-65, health care providers may recommend pelvic exams and Pap testing every 3 years, or they may recommend the Pap and pelvic exam, combined with testing for human papilloma virus (HPV), every 5 years. Some types of HPV increase your risk of cervical cancer. Testing for HPV may also be done on women of any age with unclear Pap test results.  Other health care providers may not recommend any screening for nonpregnant women who are considered low risk for pelvic cancer and who do not have symptoms. Ask your health care provider if a screening pelvic exam is right for you.  If you have had past treatment for cervical cancer or a condition that could lead to cancer, you need Pap tests and screening for cancer for at least 20 years after your treatment. If Pap tests have been discontinued, your risk factors (such as having a new sexual partner) need to be reassessed to determine if screening should  resume. Some women have medical problems that increase the chance of getting cervical cancer. In these cases, your health care provider may recommend more frequent screening and Pap tests.  Colorectal Cancer  This type of cancer can be detected and often prevented.  Routine colorectal cancer screening usually begins at 27 years of age and continues through 27 years of age.  Your health care provider may recommend screening at an earlier age if you have risk factors for colon cancer.  Your health care provider may also recommend using home test kits to check for hidden blood in the stool.  A small camera at the end of a tube can be used to examine your colon directly (sigmoidoscopy or colonoscopy). This is done to check for the earliest forms of colorectal cancer.  Routine screening usually begins at age 16.  Direct examination of the colon should be repeated every 5-10 years through 27 years of age. However, you may need to be screened more often if early forms of precancerous polyps or  small growths are found.  Skin Cancer  Check your skin from head to toe regularly.  Tell your health care provider about any new moles or changes in moles, especially if there is a change in a mole's shape or color.  Also tell your health care provider if you have a mole that is larger than the size of a pencil eraser.  Always use sunscreen. Apply sunscreen liberally and repeatedly throughout the day.  Protect yourself by wearing long sleeves, pants, a wide-brimmed hat, and sunglasses whenever you are outside.  Heart disease, diabetes, and high blood pressure  High blood pressure causes heart disease and increases the risk of stroke. High blood pressure is more likely to develop in: ? People who have blood pressure in the high end of the normal range (130-139/85-89 mm Hg). ? People who are overweight or obese. ? People who are African American.  If you are 61-36 years of age, have your blood  pressure checked every 3-5 years. If you are 6 years of age or older, have your blood pressure checked every year. You should have your blood pressure measured twice-once when you are at a hospital or clinic, and once when you are not at a hospital or clinic. Record the average of the two measurements. To check your blood pressure when you are not at a hospital or clinic, you can use: ? An automated blood pressure machine at a pharmacy. ? A home blood pressure monitor.  If you are between 7 years and 29 years old, ask your health care provider if you should take aspirin to prevent strokes.  Have regular diabetes screenings. This involves taking a blood sample to check your fasting blood sugar level. ? If you are at a normal weight and have a low risk for diabetes, have this test once every three years after 27 years of age. ? If you are overweight and have a high risk for diabetes, consider being tested at a younger age or more often. Preventing infection Hepatitis B  If you have a higher risk for hepatitis B, you should be screened for this virus. You are considered at high risk for hepatitis B if: ? You were born in a country where hepatitis B is common. Ask your health care provider which countries are considered high risk. ? Your parents were born in a high-risk country, and you have not been immunized against hepatitis B (hepatitis B vaccine). ? You have HIV or AIDS. ? You use needles to inject street drugs. ? You live with someone who has hepatitis B. ? You have had sex with someone who has hepatitis B. ? You get hemodialysis treatment. ? You take certain medicines for conditions, including cancer, organ transplantation, and autoimmune conditions.  Hepatitis C  Blood testing is recommended for: ? Everyone born from [birth year] through 1965. ? Anyone with known risk factors for hepatitis C.  Sexually transmitted infections (STIs)  You should be screened for sexually transmitted  infections (STIs) including gonorrhea and chlamydia if: ? You are sexually active and are younger than 27 years of age. ? You are older than 27 years of age and your health care provider tells you that you are at risk for this type of infection. ? Your sexual activity has changed since you were last screened and you are at an increased risk for chlamydia or gonorrhea. Ask your health care provider if you are at risk.  If you do not have HIV, but are at risk, it  may be recommended that you take a prescription medicine daily to prevent HIV infection. This is called pre-exposure prophylaxis (PrEP). You are considered at risk if: ? You are sexually active and do not regularly use condoms or know the HIV status of your partner(s). ? You take drugs by injection. ? You are sexually active with a partner who has HIV.  Talk with your health care provider about whether you are at high risk of being infected with HIV. If you choose to begin PrEP, you should first be tested for HIV. You should then be tested every 3 months for as long as you are taking PrEP. Pregnancy  If you are premenopausal and you may become pregnant, ask your health care provider about preconception counseling.  If you may become pregnant, take 400 to 800 micrograms (mcg) of folic acid every day.  If you want to prevent pregnancy, talk to your health care provider about birth control (contraception). Osteoporosis and menopause  Osteoporosis is a disease in which the bones lose minerals and strength with aging. This can result in serious bone fractures. Your risk for osteoporosis can be identified using a bone density scan.  If you are 65 years of age or older, or if you are at risk for osteoporosis and fractures, ask your health care provider if you should be screened.  Ask your health care provider whether you should take a calcium or vitamin D supplement to lower your risk for osteoporosis.  Menopause may have certain physical  symptoms and risks.  Hormone replacement therapy may reduce some of these symptoms and risks. Talk to your health care provider about whether hormone replacement therapy is right for you. Follow these instructions at home:  Schedule regular health, dental, and eye exams.  Stay current with your immunizations.  Do not use any tobacco products including cigarettes, chewing tobacco, or electronic cigarettes.  If you are pregnant, do not drink alcohol.  If you are breastfeeding, limit how much and how often you drink alcohol.  Limit alcohol intake to no more than 1 drink per day for nonpregnant women. One drink equals 12 ounces of beer, 5 ounces of wine, or 1 ounces of hard liquor.  Do not use street drugs.  Do not share needles.  Ask your health care provider for help if you need support or information about quitting drugs.  Tell your health care provider if you often feel depressed.  Tell your health care provider if you have ever been abused or do not feel safe at home. This information is not intended to replace advice given to you by your health care provider. Make sure you discuss any questions you have with your health care provider. Document Released: 09/05/2010 Document Revised: 07/29/2015 Document Reviewed: 11/24/2014 Elsevier Interactive Patient Education  2018 [employer]  

## 2017-06-30 NOTE — Progress Notes (Signed)
[place] 27 y.o.   Chief Complaint  Patient presents with  . GPD PE    HISTORY OF PRESENT ILLNESS: This is a 27 y.o. female here for annual physical exam.  No complaints or medical concerns.  Healthy female with no significant past medical history except for cholecystectomy secondary to gallstones.  Non-smoker in good physical condition.  HPI   Prior to Admission medications   Medication Sig Start Date End Date Taking? Authorizing Provider  levocetirizine (XYZAL) 5 MG tablet Take 5 mg by mouth every evening.   Yes [provider]  FLUoxetine (PROZAC) 10 MG tablet Take 10 mg by mouth at bedtime.    [provider]  PARAGARD INTRAUTERINE COPPER IUD IUD 1 each by Intrauterine route once.    [provider]    Allergies  Allergen Reactions  . Ibuprofen Other (See Comments)    Eye Swelling     Patient Active Problem List   Diagnosis Date Noted  . SVD (spontaneous vaginal delivery) 04/03/2013  . Anemia due to blood loss, acute 04/03/2013  . Postpartum care following vaginal delivery (1/27) 04/01/2013    Past Medical History:  Diagnosis Date  . Anemia due to blood loss, acute 04/03/2013  . History of asthma    as a child  . History of hypertension    "due to Mirena but no problems since it was removed"  . Insulin resistance     Past Surgical History:  Procedure Laterality Date  . CHOLECYSTECTOMY N/A 01/14/2016   Procedure: LAPAROSCOPIC CHOLECYSTECTOMY;  Surgeon: Ralene Ok, MD;  Location: [facility];  Service: General;  Laterality: N/A;  . NO PAST SURGERIES      Social History   Socioeconomic History  . Marital status: Married    Spouse name: Not on file  . Number of children: Not on file  . Years of education: Not on file  . Highest education level: Not on file  Occupational History  . Not on file  Social Needs  . Financial resource strain: Not on file  . Food insecurity:    Worry: Not on file    Inability: Not on  file  . Transportation needs:    Medical: Not on file    Non-medical: Not on file  Tobacco Use  . Smoking status: Never Smoker  . Smokeless tobacco: Never Used  Substance and Sexual Activity  . Alcohol use: No  . Drug use: No  . Sexual activity: Yes    Birth control/protection: IUD  Lifestyle  . Physical activity:    Days per week: Not on file    Minutes per session: Not on file  . Stress: Not on file  Relationships  . Social connections:    Talks on phone: Not on file    Gets together: Not on file    Attends religious service: Not on file    Active member of club or organization: Not on file    Attends meetings of clubs or organizations: Not on file    Relationship status: Not on file  . Intimate partner violence:    Fear of current or ex partner: Not on file    Emotionally abused: Not on file    Physically abused: Not on file    Forced sexual activity: Not on file  Other Topics Concern  . Not on file  Social History Narrative  . Not on file    Family History  Problem Relation Age of Onset  . Diabetes Maternal  Grandmother      Review of Systems  Constitutional: Negative.  Negative for chills and fever.  HENT: Negative.  Negative for congestion, hearing loss and sore throat.   Eyes: Negative.  Negative for blurred vision and double vision.  Respiratory: Negative.  Negative for cough and shortness of breath.   Cardiovascular: Negative.  Negative for chest pain and palpitations.  Gastrointestinal: Negative.  Negative for abdominal pain, nausea and vomiting.  Genitourinary: Negative.  Negative for dysuria and hematuria.  Musculoskeletal: Negative.  Negative for back pain, myalgias and neck pain.  Skin: Negative.   Neurological: Negative.  Negative for dizziness and headaches.  Endo/Heme/Allergies: Negative.   All other systems reviewed and are negative.   Vitals:   06/30/17 1448  BP: 110/80  Pulse: 76  Resp: 16  Temp: 98.7 F (37.1 C)  SpO2: 96%     Physical Exam  Constitutional: She is oriented to person, place, and time. She appears well-developed and well-nourished.  HENT:  Head: Normocephalic and atraumatic.  Right Ear: External ear normal.  Left Ear: External ear normal.  Nose: Nose normal.  Mouth/Throat: Oropharynx is clear and moist.  Eyes: Pupils are equal, round, and reactive to light. Conjunctivae and EOM are normal.  Neck: Normal range of motion. Neck supple. No JVD present. No thyromegaly present.  Cardiovascular: Normal rate, regular rhythm, normal heart sounds and intact distal pulses.  Pulmonary/Chest: Effort normal and breath sounds normal. No respiratory distress. She has no wheezes. She has no rales.  Abdominal: Soft. Bowel sounds are normal. She exhibits no distension and no mass. There is no tenderness. There is no guarding. No hernia.  Musculoskeletal: Normal range of motion. She exhibits no edema or tenderness.  Lymphadenopathy:    She has no cervical adenopathy.  Neurological: She is alert and oriented to person, place, and time. No cranial nerve deficit or sensory deficit. She exhibits normal muscle tone. Coordination normal.  Skin: Skin is warm and dry. Capillary refill takes less than 2 seconds. No rash noted.  Psychiatric: She has a normal mood and affect. Her behavior is normal.  Vitals reviewed.    ASSESSMENT & PLAN: Herbert was seen today for gpd pe.  Diagnoses and all orders for this visit:  Routine general medical examination at a health care facility   Patient Instructions       IF you received an x-ray today, you will receive an invoice from [facility]. Please contact [facility] at [phone] with questions or concerns regarding your invoice.   IF you received labwork today, you will receive an invoice from [facility]. Please contact [facility] at [phone] with questions or concerns regarding your invoice.   Our billing staff will not be able to assist you  with questions regarding bills from these companies.  You will be contacted with the lab results as soon as they are available. The fastest way to get your results is to activate your My Chart account. Instructions are located on the last page of this paperwork. If you have not heard from [place] regarding the results in 2 weeks, please contact this office.    Health Maintenance, Female Adopting a healthy lifestyle and getting preventive care can go a long way to promote health and wellness. Talk with your health care provider about what schedule of regular examinations is right for you. This is a good chance for you to check in with your provider about disease prevention and staying healthy. In between checkups, there are plenty of things  you can do on your own. Experts have done a lot of research about which lifestyle changes and preventive measures are most likely to keep you healthy. Ask your health care provider for more information. Weight and diet Eat a healthy diet  Be sure to include plenty of vegetables, fruits, low-fat dairy products, and lean protein.  Do not eat a lot of foods high in solid fats, added sugars, or salt.  Get regular exercise. This is one of the most important things you can do for your health. ? Most adults should exercise for at least 150 minutes each week. The exercise should increase your heart rate and make you sweat (moderate-intensity exercise). ? Most adults should also do strengthening exercises at least twice a week. This is in addition to the moderate-intensity exercise.  Maintain a healthy weight  Body mass index (BMI) is a measurement that can be used to identify possible weight problems. It estimates body fat based on height and weight. Your health care provider can help determine your BMI and help you achieve or maintain a healthy weight.  For females 20 years of age and older: ? A BMI below 18.5 is considered underweight. ? A BMI of 18.5 to 24.9 is  normal. ? A BMI of 25 to 29.9 is considered overweight. ? A BMI of 30 and above is considered obese.  Watch levels of cholesterol and blood lipids  You should start having your blood tested for lipids and cholesterol at 27 years of age, then have this test every 5 years.  You may need to have your cholesterol levels checked more often if: ? Your lipid or cholesterol levels are high. ? You are older than 27 years of age. ? You are at high risk for heart disease.  Cancer screening Lung Cancer  Lung cancer screening is recommended for adults 31-70 years old who are at high risk for lung cancer because of a history of smoking.  A yearly low-dose CT scan of the lungs is recommended for people who: ? Currently smoke. ? Have quit within the past 15 years. ? Have at least a 30-pack-year history of smoking. A pack year is smoking an average of one pack of cigarettes a day for 1 year.  Yearly screening should continue until it has been 15 years since you quit.  Yearly screening should stop if you develop a health problem that would prevent you from having lung cancer treatment.  Breast Cancer  Practice breast self-awareness. This means understanding how your breasts normally appear and feel.  It also means doing regular breast self-exams. Let your health care provider know about any changes, no matter how small.  If you are in your 20s or 30s, you should have a clinical breast exam (CBE) by a health care provider every 1-3 years as part of a regular health exam.  If you are 70 or older, have a CBE every year. Also consider having a breast X-ray (mammogram) every year.  If you have a family history of breast cancer, talk to your health care provider about genetic screening.  If you are at high risk for breast cancer, talk to your health care provider about having an MRI and a mammogram every year.  Breast cancer gene (BRCA) assessment is recommended for women who have family members  with BRCA-related cancers. BRCA-related cancers include: ? Breast. ? Ovarian. ? Tubal. ? Peritoneal cancers.  Results of the assessment will determine the need for genetic counseling and BRCA1 and BRCA2  testing.  Cervical Cancer Your health care provider may recommend that you be screened regularly for cancer of the pelvic organs (ovaries, uterus, and vagina). This screening involves a pelvic examination, including checking for microscopic changes to the surface of your cervix (Pap test). You may be encouraged to have this screening done every 3 years, beginning at age 67.  For women ages 95-65, health care providers may recommend pelvic exams and Pap testing every 3 years, or they may recommend the Pap and pelvic exam, combined with testing for human papilloma virus (HPV), every 5 years. Some types of HPV increase your risk of cervical cancer. Testing for HPV may also be done on women of any age with unclear Pap test results.  Other health care providers may not recommend any screening for nonpregnant women who are considered low risk for pelvic cancer and who do not have symptoms. Ask your health care provider if a screening pelvic exam is right for you.  If you have had past treatment for cervical cancer or a condition that could lead to cancer, you need Pap tests and screening for cancer for at least 20 years after your treatment. If Pap tests have been discontinued, your risk factors (such as having a new sexual partner) need to be reassessed to determine if screening should resume. Some women have medical problems that increase the chance of getting cervical cancer. In these cases, your health care provider may recommend more frequent screening and Pap tests.  Colorectal Cancer  This type of cancer can be detected and often prevented.  Routine colorectal cancer screening usually begins at 27 years of age and continues through 27 years of age.  Your health care provider may recommend  screening at an earlier age if you have risk factors for colon cancer.  Your health care provider may also recommend using home test kits to check for hidden blood in the stool.  A small camera at the end of a tube can be used to examine your colon directly (sigmoidoscopy or colonoscopy). This is done to check for the earliest forms of colorectal cancer.  Routine screening usually begins at age 88.  Direct examination of the colon should be repeated every 5-10 years through 27 years of age. However, you may need to be screened more often if early forms of precancerous polyps or small growths are found.  Skin Cancer  Check your skin from head to toe regularly.  Tell your health care provider about any new moles or changes in moles, especially if there is a change in a mole's shape or color.  Also tell your health care provider if you have a mole that is larger than the size of a pencil eraser.  Always use sunscreen. Apply sunscreen liberally and repeatedly throughout the day.  Protect yourself by wearing long sleeves, pants, a wide-brimmed hat, and sunglasses whenever you are outside.  Heart disease, diabetes, and high blood pressure  High blood pressure causes heart disease and increases the risk of stroke. High blood pressure is more likely to develop in: ? People who have blood pressure in the high end of the normal range (130-139/85-89 mm Hg). ? People who are overweight or obese. ? People who are African American.  If you are 16-88 years of age, have your blood pressure checked every 3-5 years. If you are 12 years of age or older, have your blood pressure checked every year. You should have your blood pressure measured twice-once when you are at a  hospital or clinic, and once when you are not at a hospital or clinic. Record the average of the two measurements. To check your blood pressure when you are not at a hospital or clinic, you can use: ? An automated blood pressure machine at  a pharmacy. ? A home blood pressure monitor.  If you are between 65 years and 1 years old, ask your health care provider if you should take aspirin to prevent strokes.  Have regular diabetes screenings. This involves taking a blood sample to check your fasting blood sugar level. ? If you are at a normal weight and have a low risk for diabetes, have this test once every three years after 27 years of age. ? If you are overweight and have a high risk for diabetes, consider being tested at a younger age or more often. Preventing infection Hepatitis B  If you have a higher risk for hepatitis B, you should be screened for this virus. You are considered at high risk for hepatitis B if: ? You were born in a country where hepatitis B is common. Ask your health care provider which countries are considered high risk. ? Your parents were born in a high-risk country, and you have not been immunized against hepatitis B (hepatitis B vaccine). ? You have HIV or AIDS. ? You use needles to inject street drugs. ? You live with someone who has hepatitis B. ? You have had sex with someone who has hepatitis B. ? You get hemodialysis treatment. ? You take certain medicines for conditions, including cancer, organ transplantation, and autoimmune conditions.  Hepatitis C  Blood testing is recommended for: ? Everyone born from [birth year] through 1965. ? Anyone with known risk factors for hepatitis C.  Sexually transmitted infections (STIs)  You should be screened for sexually transmitted infections (STIs) including gonorrhea and chlamydia if: ? You are sexually active and are younger than 27 years of age. ? You are older than 27 years of age and your health care provider tells you that you are at risk for this type of infection. ? Your sexual activity has changed since you were last screened and you are at an increased risk for chlamydia or gonorrhea. Ask your health care provider if you are at risk.  If you do  not have HIV, but are at risk, it may be recommended that you take a prescription medicine daily to prevent HIV infection. This is called pre-exposure prophylaxis (PrEP). You are considered at risk if: ? You are sexually active and do not regularly use condoms or know the HIV status of your partner(s). ? You take drugs by injection. ? You are sexually active with a partner who has HIV.  Talk with your health care provider about whether you are at high risk of being infected with HIV. If you choose to begin PrEP, you should first be tested for HIV. You should then be tested every 3 months for as long as you are taking PrEP. Pregnancy  If you are premenopausal and you may become pregnant, ask your health care provider about preconception counseling.  If you may become pregnant, take 400 to 800 micrograms (mcg) of folic acid every day.  If you want to prevent pregnancy, talk to your health care provider about birth control (contraception). Osteoporosis and menopause  Osteoporosis is a disease in which the bones lose minerals and strength with aging. This can result in serious bone fractures. Your risk for osteoporosis can be identified using a bone  density scan.  If you are 59 years of age or older, or if you are at risk for osteoporosis and fractures, ask your health care provider if you should be screened.  Ask your health care provider whether you should take a calcium or vitamin D supplement to lower your risk for osteoporosis.  Menopause may have certain physical symptoms and risks.  Hormone replacement therapy may reduce some of these symptoms and risks. Talk to your health care provider about whether hormone replacement therapy is right for you. Follow these instructions at home:  Schedule regular health, dental, and eye exams.  Stay current with your immunizations.  Do not use any tobacco products including cigarettes, chewing tobacco, or electronic cigarettes.  If you are  pregnant, do not drink alcohol.  If you are breastfeeding, limit how much and how often you drink alcohol.  Limit alcohol intake to no more than 1 drink per day for nonpregnant women. One drink equals 12 ounces of beer, 5 ounces of wine, or 1 ounces of hard liquor.  Do not use street drugs.  Do not share needles.  Ask your health care provider for help if you need support or information about quitting drugs.  Tell your health care provider if you often feel depressed.  Tell your health care provider if you have ever been abused or do not feel safe at home. This information is not intended to replace advice given to you by your health care provider. Make sure you discuss any questions you have with your health care provider. Document Released: 09/05/2010 Document Revised: 07/29/2015 Document Reviewed: 11/24/2014 Elsevier Interactive Patient Education  2018 [employer]      Agustina Caroli, MD [facility]

## 2017-09-13 ENCOUNTER — Other Ambulatory Visit: Payer: Self-pay | Admitting: Nurse Practitioner

## 2017-09-13 ENCOUNTER — Ambulatory Visit
Admission: RE | Admit: 2017-09-13 | Discharge: 2017-09-13 | Disposition: A | Discharge status: Home / Self Care | Payer: No Typology Code available for payment source | Source: Ambulatory Visit | Attending: Nurse Practitioner | Admitting: Nurse Practitioner

## 2017-09-13 DIAGNOSIS — Z021 Encounter for pre-employment examination: Secondary | ICD-10-CM

## 2020-07-21 ENCOUNTER — Other Ambulatory Visit: Payer: Self-pay

## 2020-07-21 ENCOUNTER — Emergency Department (HOSPITAL_BASED_OUTPATIENT_CLINIC_OR_DEPARTMENT_OTHER): Payer: 59

## 2020-07-21 ENCOUNTER — Emergency Department (HOSPITAL_BASED_OUTPATIENT_CLINIC_OR_DEPARTMENT_OTHER)
Admission: EM | Admit: 2020-07-21 | Discharge: 2020-07-21 | Disposition: A | Discharge status: Home / Self Care | Payer: 59 | Attending: Emergency Medicine | Admitting: Emergency Medicine

## 2020-07-21 ENCOUNTER — Encounter (HOSPITAL_BASED_OUTPATIENT_CLINIC_OR_DEPARTMENT_OTHER): Payer: Self-pay

## 2020-07-21 DIAGNOSIS — R0602 Shortness of breath: Secondary | ICD-10-CM | POA: Diagnosis not present

## 2020-07-21 DIAGNOSIS — J45909 Unspecified asthma, uncomplicated: Secondary | ICD-10-CM | POA: Diagnosis not present

## 2020-07-21 DIAGNOSIS — Z20822 Contact with and (suspected) exposure to covid-19: Secondary | ICD-10-CM | POA: Diagnosis not present

## 2020-07-21 DIAGNOSIS — I10 Essential (primary) hypertension: Secondary | ICD-10-CM | POA: Insufficient documentation

## 2020-07-21 DIAGNOSIS — R059 Cough, unspecified: Secondary | ICD-10-CM | POA: Diagnosis not present

## 2020-07-21 LAB — RESP PANEL BY RT-PCR (FLU A&B, COVID) ARPGX2
Influenza A by PCR: NEGATIVE
Influenza B by PCR: NEGATIVE
SARS Coronavirus 2 by RT PCR: NEGATIVE

## 2020-07-21 MED ORDER — ALBUTEROL SULFATE HFA 108 (90 BASE) MCG/ACT IN AERS: 2.0000 | INHALATION_SPRAY | Freq: Four times a day (QID) | RESPIRATORY_TRACT | 0 refills | Status: AC | PRN

## 2020-07-21 NOTE — Discharge Instructions (Signed)
Use the albuterol inhaler 2 puffs every 6 hours for the next 7 days.  Prescription provided.  COVID and influenza testing is pending.  Followed up on MyChart.  Work note provided.

## 2020-07-21 NOTE — ED Provider Notes (Signed)
[facility] EMERGENCY DEPT Provider Note   CSN: [id number] Arrival date & time: 07/21/20  0841     History Chief Complaint  Patient presents with  . Cough    Brandy Bradley is a 30 y.o. female.  Patient with a complaint of a persistent cough for about a week with some shortness of breath no fever.  COVID test at home was negative.  Patient's daughter with similar symptoms.  Patient's had a history of asthma in the past.  Feels like that.  No nausea vomiting or diarrhea.        Past Medical History:  Diagnosis Date  . Anemia due to blood loss, acute 04/03/2013  . History of asthma    as a child  . History of hypertension    "due to Mirena but no problems since it was removed"  . Insulin resistance     Patient Active Problem List   Diagnosis Date Noted  . SVD (spontaneous vaginal delivery) 04/03/2013  . Anemia due to blood loss, acute 04/03/2013  . Postpartum care following vaginal delivery (1/27) 04/01/2013    Past Surgical History:  Procedure Laterality Date  . CHOLECYSTECTOMY N/A 01/14/2016   Procedure: LAPAROSCOPIC CHOLECYSTECTOMY;  Surgeon: Axel Filler, MD;  Location: [facility] OR;  Service: General;  Laterality: N/A;  . NO PAST SURGERIES       OB History    Gravida  1   Para  1   Term  1   Preterm      AB      Living  1     SAB      IAB      Ectopic      Multiple      Live Births  1           Family History  Problem Relation Age of Onset  . Diabetes Maternal Grandmother     Social History   Tobacco Use  . Smoking status: Never Smoker  . Smokeless tobacco: Never Used  Substance Use Topics  . Alcohol use: No  . Drug use: No    Home Medications Prior to Admission medications   Medication Sig Start Date End Date Taking? Authorizing Provider  albuterol (VENTOLIN HFA) 108 (90 Base) MCG/ACT inhaler Inhale 2 puffs into the lungs every 6 (six) hours as needed for wheezing or shortness of breath. 07/21/20  Yes  Vanetta Mulders, MD  FLUoxetine (PROZAC) 10 MG tablet Take 10 mg by mouth at bedtime.    [provider]  levocetirizine (XYZAL) 5 MG tablet Take 5 mg by mouth every evening.    [provider]  PARAGARD INTRAUTERINE COPPER IUD IUD 1 each by Intrauterine route once.    [provider]    Allergies    Ibuprofen  Review of Systems   Review of Systems  Constitutional: Negative for chills and fever.  HENT: Negative for rhinorrhea and sore throat.   Eyes: Negative for visual disturbance.  Respiratory: Positive for cough and shortness of breath.   Cardiovascular: Negative for chest pain and leg swelling.  Gastrointestinal: Negative for abdominal pain, diarrhea, nausea and vomiting.  Genitourinary: Negative for dysuria.  Musculoskeletal: Negative for back pain and neck pain.  Skin: Negative for rash.  Neurological: Negative for dizziness, light-headedness and headaches.  Hematological: Does not bruise/bleed easily.  Psychiatric/Behavioral: Negative for confusion.    Physical Exam Updated Vital Signs BP 123/75 (BP Location: Right Arm)   Pulse 78   Temp 98.1 F (  36.7 C)   Resp 19   Ht 1.626 m (5\' 4" )   Wt 72.6 kg   SpO2 100%   BMI 27.46 kg/m   Physical Exam Vitals and nursing note reviewed.  Constitutional:      General: Brandy Bradley is not in acute distress.    Appearance: Normal appearance. Brandy Bradley is well-developed.  HENT:     Head: Normocephalic and atraumatic.  Eyes:     Extraocular Movements: Extraocular movements intact.     Conjunctiva/sclera: Conjunctivae normal.     Pupils: Pupils are equal, round, and reactive to light.  Cardiovascular:     Rate and Rhythm: Normal rate and regular rhythm.     Heart sounds: No murmur heard.   Pulmonary:     Effort: Pulmonary effort is normal. No respiratory distress.     Breath sounds: Normal breath sounds. No stridor. No wheezing, rhonchi or rales.  Abdominal:     Palpations: Abdomen is soft.     Tenderness:  There is no abdominal tenderness.  Musculoskeletal:     Cervical back: Neck supple.  Skin:    General: Skin is warm and dry.     Capillary Refill: Capillary refill takes less than 2 seconds.  Neurological:     General: No focal deficit present.     Mental Status: Brandy Bradley is alert and oriented to person, place, and time.     Cranial Nerves: No cranial nerve deficit.     Sensory: No sensory deficit.     Motor: No weakness.     ED Results / Procedures / Treatments   Labs (all labs ordered are listed, but only abnormal results are displayed) Labs Reviewed  RESP PANEL BY RT-PCR (FLU A&B, COVID) [id number]    EKG None  Radiology DG Chest Port 1 View  Result Date: 07/21/2020 CLINICAL DATA:  Cough, congestion, shortness of breath for 1 week EXAM: PORTABLE CHEST 1 VIEW COMPARISON:  08/14/2017 FINDINGS: The heart size and mediastinal contours are within normal limits. Both lungs are clear. The visualized skeletal structures are unremarkable. IMPRESSION: No active disease. Electronically Signed   By: 10/14/2017 M.D.   On: 07/21/2020 10:02    Procedures Procedures   Medications Ordered in ED Medications - No data to display  ED Course  I have reviewed the triage vital signs and the nursing notes.  Pertinent labs & imaging results that were available during my care of the patient were reviewed by me and considered in my medical decision making (see chart for details).    MDM Rules/Calculators/A&P                          Lung exam without any wheezing here at all.  The patient's had a history of asthma in the past.  Feel Brandy Bradley is got an upper respiratory infection resulting in the cough and shortness of breath.  COVID testing influenza testing formally done and is pending Brandy Bradley will followed up on MyChart.  We will give a trial of albuterol inhaler to use in the meantime.  Work note provided.    Final Clinical Impression(s) / ED Diagnoses Final diagnoses:  Cough  SOB (shortness of  breath)    Rx / DC Orders ED Discharge Orders         Ordered    albuterol (VENTOLIN HFA) 108 (90 Base) MCG/ACT inhaler  Every 6 hours PRN        07/21/20 1034  Vanetta Mulders, MD 07/21/20 1037

## 2020-07-21 NOTE — ED Triage Notes (Signed)
Persistent, non productive cough x 1 week, some shortness of breath, denies fever, negative home covid test yesterday.  Hx of asthma.

## 2021-05-23 ENCOUNTER — Emergency Department (HOSPITAL_COMMUNITY): Payer: No Typology Code available for payment source

## 2021-05-23 ENCOUNTER — Emergency Department (HOSPITAL_COMMUNITY)
Admission: EM | Admit: 2021-05-23 | Discharge: 2021-05-24 | Disposition: A | Discharge status: Home / Self Care | Payer: No Typology Code available for payment source | Attending: Emergency Medicine | Admitting: Emergency Medicine

## 2021-05-23 DIAGNOSIS — J45909 Unspecified asthma, uncomplicated: Secondary | ICD-10-CM | POA: Diagnosis not present

## 2021-05-23 DIAGNOSIS — S6991XA Unspecified injury of right wrist, hand and finger(s), initial encounter: Secondary | ICD-10-CM | POA: Diagnosis present

## 2021-05-23 DIAGNOSIS — I10 Essential (primary) hypertension: Secondary | ICD-10-CM | POA: Insufficient documentation

## 2021-05-23 DIAGNOSIS — S60221A Contusion of right hand, initial encounter: Secondary | ICD-10-CM | POA: Diagnosis not present
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
## 2021-05-24 NOTE — Discharge Instructions (Signed)
You were seen today for right hand pain.  This is consistent with a contusion.  Apply ice and take Tylenol as needed. ?

## 2021-05-24 NOTE — ED Notes (Signed)
Pt's hand wrapped with an ACE bandage for comfort ?

## 2022-09-05 ENCOUNTER — Other Ambulatory Visit: Payer: Self-pay

## 2022-09-05 ENCOUNTER — Emergency Department (HOSPITAL_COMMUNITY)
Admission: EM | Admit: 2022-09-05 | Discharge: 2022-09-06 | Disposition: A | Discharge status: Home / Self Care | Payer: Worker's Compensation | Attending: Emergency Medicine | Admitting: Emergency Medicine

## 2022-09-05 DIAGNOSIS — S4992XA Unspecified injury of left shoulder and upper arm, initial encounter: Secondary | ICD-10-CM

## 2022-09-05 DIAGNOSIS — S5012XA Contusion of left forearm, initial encounter: Secondary | ICD-10-CM | POA: Diagnosis not present

## 2022-09-05 DIAGNOSIS — W504XXA Accidental scratch by another person, initial encounter: Secondary | ICD-10-CM | POA: Diagnosis not present

## 2022-09-05 DIAGNOSIS — S40812A Abrasion of left upper arm, initial encounter: Secondary | ICD-10-CM

## 2022-09-05 MED ORDER — BACITRACIN ZINC 500 UNIT/GM EX OINT
TOPICAL_OINTMENT | Freq: Once | CUTANEOUS | Status: AC
  Administered 2022-09-06: 1 via TOPICAL
  Filled 2022-09-05: qty 0.9
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
## 2022-09-05 NOTE — Discharge Instructions (Addendum)
It was a pleasure taking care of you today!
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
## 2022-09-06 LAB — RAPID HIV SCREEN (HIV 1/2 AB+AG)
HIV 1/2 Antibodies: NONREACTIVE
HIV-1 P24 Antigen - HIV24: NONREACTIVE

## 2022-09-06 LAB — HEPATITIS B SURFACE ANTIGEN: Hepatitis B Surface Ag: NONREACTIVE

## 2022-09-06 MED ORDER — DOXYCYCLINE HYCLATE 100 MG PO CAPS: 100.0000 mg | ORAL_CAPSULE | Freq: Two times a day (BID) | ORAL | 0 refills | Status: AC

## 2022-09-07 LAB — HCV INTERPRETATION

## 2022-09-07 LAB — HCV AB W REFLEX TO QUANT PCR: HCV Ab: NONREACTIVE

## 2023-02-28 ENCOUNTER — Encounter (HOSPITAL_BASED_OUTPATIENT_CLINIC_OR_DEPARTMENT_OTHER): Payer: Self-pay

## 2023-02-28 ENCOUNTER — Emergency Department (HOSPITAL_BASED_OUTPATIENT_CLINIC_OR_DEPARTMENT_OTHER)
Admission: EM | Admit: 2023-02-28 | Discharge: 2023-02-28 | Disposition: A | Discharge status: Home / Self Care | Payer: 59 | Attending: Emergency Medicine | Admitting: Emergency Medicine

## 2023-02-28 ENCOUNTER — Other Ambulatory Visit: Payer: Self-pay

## 2023-02-28 DIAGNOSIS — E119 Type 2 diabetes mellitus without complications: Secondary | ICD-10-CM | POA: Insufficient documentation

## 2023-02-28 DIAGNOSIS — J45909 Unspecified asthma, uncomplicated: Secondary | ICD-10-CM | POA: Insufficient documentation

## 2023-02-28 DIAGNOSIS — K529 Noninfective gastroenteritis and colitis, unspecified: Secondary | ICD-10-CM | POA: Insufficient documentation

## 2023-02-28 DIAGNOSIS — Z7984 Long term (current) use of oral hypoglycemic drugs: Secondary | ICD-10-CM | POA: Insufficient documentation

## 2023-02-28 DIAGNOSIS — Z20822 Contact with and (suspected) exposure to covid-19: Secondary | ICD-10-CM | POA: Diagnosis not present

## 2023-02-28 DIAGNOSIS — R112 Nausea with vomiting, unspecified: Secondary | ICD-10-CM | POA: Diagnosis present

## 2023-02-28 LAB — CBC
HCT: 45 % (ref 36.0–46.0)
Hemoglobin: 15.2 g/dL — ABNORMAL HIGH (ref 12.0–15.0)
MCH: 30.6 pg (ref 26.0–34.0)
MCHC: 33.8 g/dL (ref 30.0–36.0)
MCV: 90.7 fL (ref 80.0–100.0)
Platelets: 289 10*3/uL (ref 150–400)
RBC: 4.96 MIL/uL (ref 3.87–5.11)
RDW: 12.7 % (ref 11.5–15.5)
WBC: 5.6 10*3/uL (ref 4.0–10.5)
nRBC: 0 % (ref 0.0–0.2)

## 2023-02-28 LAB — RESP PANEL BY RT-PCR (RSV, FLU A&B, COVID)  RVPGX2
Influenza A by PCR: NEGATIVE
Influenza B by PCR: NEGATIVE
Resp Syncytial Virus by PCR: NEGATIVE
SARS Coronavirus 2 by RT PCR: NEGATIVE

## 2023-02-28 LAB — LIPASE, BLOOD: Lipase: 12 U/L (ref 11–51)

## 2023-02-28 LAB — COMPREHENSIVE METABOLIC PANEL
ALT: 14 U/L (ref 0–44)
AST: 19 U/L (ref 15–41)
Albumin: 4.6 g/dL (ref 3.5–5.0)
Alkaline Phosphatase: 62 U/L (ref 38–126)
Anion gap: 7 (ref 5–15)
BUN: 10 mg/dL (ref 6–20)
CO2: 29 mmol/L (ref 22–32)
Calcium: 9 mg/dL (ref 8.9–10.3)
Chloride: 105 mmol/L (ref 98–111)
Creatinine, Ser: 0.68 mg/dL (ref 0.44–1.00)
GFR, Estimated: >60 mL/min (ref >60)
Glucose, Bld: 107 mg/dL — ABNORMAL HIGH (ref 70–99)
Potassium: 4.1 mmol/L (ref 3.5–5.1)
Sodium: 141 mmol/L (ref 135–145)
Total Bilirubin: 0.5 mg/dL (ref <1.2)
Total Protein: 7.7 g/dL (ref 6.5–8.1)

## 2023-02-28 LAB — URINALYSIS, ROUTINE W REFLEX MICROSCOPIC
Bilirubin Urine: NEGATIVE
Glucose, UA: NEGATIVE mg/dL
Hgb urine dipstick: NEGATIVE
Ketones, ur: NEGATIVE mg/dL
Leukocytes,Ua: NEGATIVE
Nitrite: NEGATIVE
Protein, ur: 100 mg/dL — AB
Specific Gravity, Urine: 1.031 — ABNORMAL HIGH (ref 1.005–1.030)
pH: 8.5 — ABNORMAL HIGH (ref 5.0–8.0)

## 2023-02-28 LAB — HCG, QUANTITATIVE, PREGNANCY: hCG, Beta Chain, Quant, S: 1 m[IU]/mL (ref <5)

## 2023-02-28 MED ORDER — ONDANSETRON 4 MG PO TBDP: 4.0000 mg | ORAL_TABLET | Freq: Three times a day (TID) | ORAL | 0 refills | Status: DC | PRN

## 2023-02-28 MED ORDER — ONDANSETRON 4 MG PO TBDP: 4.0000 mg | ORAL_TABLET | Freq: Three times a day (TID) | ORAL | 0 refills | Status: AC | PRN

## 2023-02-28 MED ORDER — ONDANSETRON 4 MG PO TBDP: 4.0000 mg | ORAL_TABLET | Freq: Once | ORAL | Status: DC

## 2023-02-28 MED ORDER — ONDANSETRON HCL 4 MG/2ML IJ SOLN
4.0000 mg | Freq: Once | INTRAMUSCULAR | Status: AC
  Administered 2023-02-28: 4 mg via INTRAVENOUS
  Filled 2023-02-28: qty 2

## 2023-02-28 NOTE — ED Notes (Signed)
Pt aware of need for urine sample. Unable to void at this time. 

## 2023-02-28 NOTE — Discharge Instructions (Addendum)
You were seen in the emergency room today for nausea vomiting and abdominal pain.  Your symptoms are consistent with a gastroenteritis.  I would recommend staying well-hydrated with water alternating Pedialyte and Gatorade.  If sent Zofran to your pharmacy to take as needed for nausea.  I also encouraged liquid diet followed by bland diet.  This is foods are easier for your stomach to digest.  Please return to emergency room if you have any new or worsening symptoms.

## 2023-02-28 NOTE — ED Notes (Signed)
Dc instructions reviewed with patient. Patient voiced understanding. Dc with belongings.  °

## 2023-02-28 NOTE — ED Provider Notes (Signed)
[place] EMERGENCY DEPARTMENT AT [facility] Provider Note   CSN: [id number] Arrival date & time: 02/28/23  1151     History  Chief Complaint  Patient presents with   Emesis   Abdominal Pain    Shenell Phylis Bougie de Shon Hale is a 32 y.o. female with diabetes, allergies, asthma reporting to emergency room with sudden onset nausea vomiting diarrhea and abdominal cramping that started approximately 10 hours ago.  Patient reports she was able to eat dinner last night and had a few drinks of alcohol then shortly after started feeling sick.  Had multiple episodes of nonbloody nonbilious vomit.  Then had multiple episodes of loose stool.  She was able to sleep some last night however woke up still feeling ill so she came in.  Patient denies any fevers, chills, blood in stool, focal abdominal pain, headache, chest pain, shortness of breath, cough, URI-like symptoms.  No known sick contacts.  Patient reports that family members ate the same food and did not have any illness after. No recent travel.    Emesis Associated symptoms: abdominal pain   Abdominal Pain Associated symptoms: vomiting        Home Medications Prior to Admission medications   Medication Sig Start Date End Date Taking? Authorizing Provider  levocetirizine (XYZAL) 5 MG tablet Take 5 mg by mouth every evening.   Yes [provider]  metFORMIN (GLUMETZA) 500 MG (MOD) 24 hr tablet Take 500 mg by mouth daily with breakfast.   Yes [provider]  albuterol (VENTOLIN HFA) 108 (90 Base) MCG/ACT inhaler Inhale 2 puffs into the lungs every 6 (six) hours as needed for wheezing or shortness of breath. 07/21/20   Vanetta Mulders, MD  FLUoxetine (PROZAC) 10 MG tablet Take 10 mg by mouth at bedtime.    [provider]  HAILEY 24 FE 1-20 MG-MCG(24) tablet Take 1 tablet by mouth daily.   Yes [provider]  PARAGARD INTRAUTERINE COPPER IUD IUD 1 each by Intrauterine route once.    [provider]      Allergies    Ibuprofen    Review of Systems   Review of Systems  Gastrointestinal:  Positive for abdominal pain and vomiting.    Physical Exam Updated Vital Signs BP (!) 130/94 (BP Location: Left Arm)   Pulse 78   Temp 98.3 F (36.8 C) (Oral)   Resp 18   Ht 5\' 4"  (1.626 m)   Wt 77.1 kg   SpO2 98%   Breastfeeding No   BMI 29.18 kg/m  Physical Exam Vitals and nursing note reviewed.  Constitutional:      General: She is not in acute distress.    Appearance: She is not toxic-appearing.  HENT:     Head: Normocephalic and atraumatic.     Mouth/Throat:     Mouth: Mucous membranes are moist.     Pharynx: Oropharynx is clear.  Eyes:     General: No scleral icterus.    Conjunctiva/sclera: Conjunctivae normal.  Cardiovascular:     Rate and Rhythm: Normal rate and regular rhythm.     Pulses: Normal pulses.     Heart sounds: Normal heart sounds.  Pulmonary:     Effort: Pulmonary effort is normal. No respiratory distress.     Breath sounds: Normal breath sounds.  Abdominal:     General: Abdomen is flat. Bowel sounds are normal. There is no distension.     Palpations: Abdomen is soft. There is no mass.  Tenderness: There is no abdominal tenderness. There is no guarding or rebound. Negative signs include Murphy's sign, Rovsing's sign and McBurney's sign.     Hernia: No hernia is present.     Comments: No focal tenderness on exam, soft, nondistended.  Patient reporting generalized cramping abdominal pain that is intermittent.  Not currently having pain.  Musculoskeletal:     Right lower leg: No edema.     Left lower leg: No edema.  Skin:    General: Skin is warm and dry.     Findings: No lesion.  Neurological:     General: No focal deficit present.     Mental Status: She is alert and oriented to person, place, and time. Mental status is at baseline.     ED Results / Procedures / Treatments   Labs (all labs ordered are listed, but only abnormal  results are displayed) Labs Reviewed  COMPREHENSIVE METABOLIC PANEL - Abnormal; Notable for the following components:      Result Value   Glucose, Bld 107 (*)    All other components within normal limits  CBC - Abnormal; Notable for the following components:   Hemoglobin 15.2 (*)    All other components within normal limits  URINALYSIS, ROUTINE W REFLEX MICROSCOPIC - Abnormal; Notable for the following components:   Specific Gravity, Urine 1.031 (*)    pH 8.5 (*)    Protein, ur 100 (*)    Bacteria, UA RARE (*)    All other components within normal limits  RESP PANEL BY RT-PCR (RSV, FLU A&B, COVID)  [id number]  LIPASE, BLOOD  HCG, QUANTITATIVE, PREGNANCY    EKG None  Radiology No results found.  Procedures Procedures    Medications Ordered in ED Medications  ondansetron (ZOFRAN) injection 4 mg (has no administration in time range)    ED Course/ Medical Decision Making/ A&P Clinical Course as of 02/28/23 1456  Wed Feb 28, 2023  1455 Reassessed tolerating p.o. intake feeling better requesting discharge.  Discussed return precautions including returning if abdominal pain becomes focal, worsening symptoms, not tolerating PO intake. [JB]    Clinical Course User Index [JB] Ruthie Berch, Horald Chestnut, PA-C                                 Medical Decision Making Amount and/or Complexity of Data Reviewed Labs: ordered.  Risk Prescription drug management.   Berton Bon de Shon Hale 32 y.o. presented today for abd pain. Working DDx includes, but not limited to, gastroenteritis, colitis, SBO, appendicitis, cholecystitis, hepatobiliary pathology, gastritis, PUD, ACS, dissection, pancreatitis, nephrolithiasis, AAA, UTI, pyelonephritis, ruptured ectopic pregnancy, PID, ovarian torsion.    R/o DDx: These are considered less likely than current impression due to history of present illness, physical exam, labs/imaging findings.  Review of prior external notes: None  Pmhx: diabetes,  allergies, asthma  Unique Tests and My Interpretation:  CBC with differential: No leukocytosis, no anemia Lipase within normal limits, CMP without electrolyte abnormality and no AKI Aspiratory panel negative, pregnancy test negative, UA without nitrites or leukocytes  Imaging:  Considered however patient has nonfocal abdominal exam and reassuring lab results.   Problem List / ED Course / Critical interventions / Medication management  Reporting to emergency room with complaint of generalized abdominal pain that is cramping and intermittent as well as nausea vomiting and loose stool.  Abdominal pain without focal area of tenderness on exam, nondistended thus I do not feel  imaging is necessary at this time.  Symptoms consistent with gastroenteritis.  Patient hemodynamically stable without fever.  Labs reassuring without electrolyte abnormality.  Will p.o. challenge patient after receiving Zofran to ensure nausea has improved.  Likely will be stable for discharge and outpatient management of symptoms including oral rehydration and as needed Zofran. I ordered medication including Zofran  Reevaluation of the patient after these medicines showed that the patient improved Patients vitals assessed. Upon arrival patient is  hemodynamically stable.  I have reviewed the patients home medicines and have made adjustments as needed    Consult: None  Plan:  F/u w/ PCP in 2-3d to ensure resolution of sx.  Patient was given return precautions. Patient stable for discharge at this time.  Patient educated on current sx/dx and verbalized understanding of plan. Return to ER w/ new or worsening sx.          Final Clinical Impression(s) / ED Diagnoses Final diagnoses:  Gastroenteritis    Rx / DC Orders ED Discharge Orders     None         Romari Gasparro, Horald Chestnut, PA-C 02/28/23 1729    Rexford Maus, DO 03/02/23 [phone]

## 2023-02-28 NOTE — ED Triage Notes (Signed)
In for eval of nausea and vomiting onset 0200 this am. Abd cramping onset 0800 today. Last BM today - diarrhea.  Reports last oral intake dinner and a 3 alcoholic drinks at 1800 last pm.

## 2023-02-28 NOTE — ED Notes (Signed)
Spoke with lab concerning adding a quantitative to blood already collected

## 2023-06-07 ENCOUNTER — Other Ambulatory Visit (HOSPITAL_COMMUNITY): Payer: Self-pay

## 2023-06-07 MED ORDER — ZEPBOUND 2.5 MG/0.5ML ~~LOC~~ SOAJ
2.5000 mg | SUBCUTANEOUS | 0 refills | Status: DC
  Filled 2023-06-07 – 2023-06-08 (×3): qty 2, 28d supply, fill #0

## 2023-06-08 ENCOUNTER — Other Ambulatory Visit (HOSPITAL_COMMUNITY): Payer: Self-pay

## 2023-07-05 ENCOUNTER — Other Ambulatory Visit (HOSPITAL_COMMUNITY): Payer: Self-pay

## 2023-07-05 MED ORDER — ZEPBOUND 5 MG/0.5ML ~~LOC~~ SOAJ
5.0000 mg | SUBCUTANEOUS | 2 refills | Status: DC
  Filled 2023-07-05: qty 2, 28d supply, fill #0
  Filled 2023-07-29: qty 2, 28d supply, fill #1
  Filled 2023-08-30: qty 2, 28d supply, fill #2

## 2023-08-01 ENCOUNTER — Other Ambulatory Visit (HOSPITAL_COMMUNITY): Payer: Self-pay

## 2023-08-31 ENCOUNTER — Other Ambulatory Visit (HOSPITAL_COMMUNITY): Payer: Self-pay

## 2023-09-26 ENCOUNTER — Other Ambulatory Visit (HOSPITAL_COMMUNITY): Payer: Self-pay

## 2023-09-28 ENCOUNTER — Other Ambulatory Visit (HOSPITAL_COMMUNITY): Payer: Self-pay

## 2023-09-28 ENCOUNTER — Other Ambulatory Visit: Payer: Self-pay

## 2023-09-28 MED ORDER — ZEPBOUND 5 MG/0.5ML ~~LOC~~ SOAJ
5.0000 mg | SUBCUTANEOUS | 0 refills | Status: DC
  Filled 2023-09-28: qty 2, 28d supply, fill #0

## 2023-09-29 ENCOUNTER — Other Ambulatory Visit (HOSPITAL_COMMUNITY): Payer: Self-pay

## 2023-10-24 ENCOUNTER — Other Ambulatory Visit (HOSPITAL_COMMUNITY): Payer: Self-pay

## 2023-10-25 ENCOUNTER — Other Ambulatory Visit (HOSPITAL_COMMUNITY): Payer: Self-pay

## 2023-10-25 MED ORDER — ZEPBOUND 5 MG/0.5ML ~~LOC~~ SOAJ
5.0000 mg | SUBCUTANEOUS | 2 refills | Status: AC
  Filled 2023-10-25: qty 2, 28d supply, fill #0
  Filled 2023-11-24: qty 2, 28d supply, fill #1
  Filled 2023-12-19 – 2024-02-02 (×2): qty 2, 28d supply, fill #2

## 2023-11-24 ENCOUNTER — Other Ambulatory Visit (HOSPITAL_COMMUNITY): Payer: Self-pay

## 2023-11-26 ENCOUNTER — Other Ambulatory Visit: Payer: Self-pay

## 2023-12-19 ENCOUNTER — Other Ambulatory Visit (HOSPITAL_COMMUNITY): Payer: Self-pay

## 2023-12-24 ENCOUNTER — Telehealth: Payer: Self-pay | Admitting: Pharmacist

## 2023-12-24 ENCOUNTER — Other Ambulatory Visit (HOSPITAL_COMMUNITY): Payer: Self-pay

## 2023-12-24 ENCOUNTER — Other Ambulatory Visit: Payer: Self-pay

## 2023-12-27 ENCOUNTER — Other Ambulatory Visit (HOSPITAL_COMMUNITY): Payer: Self-pay

## 2024-01-07 IMAGING — CR DG HAND COMPLETE 3+V*R*
3 series · 3 of 3 positions shown · non-contrast
Comparison: None.

CLINICAL DATA: hand injury.  Third through fifth metacarpal pain.

EXAM:
RIGHT HAND - COMPLETE 3+ VIEW

[x hand pa right]
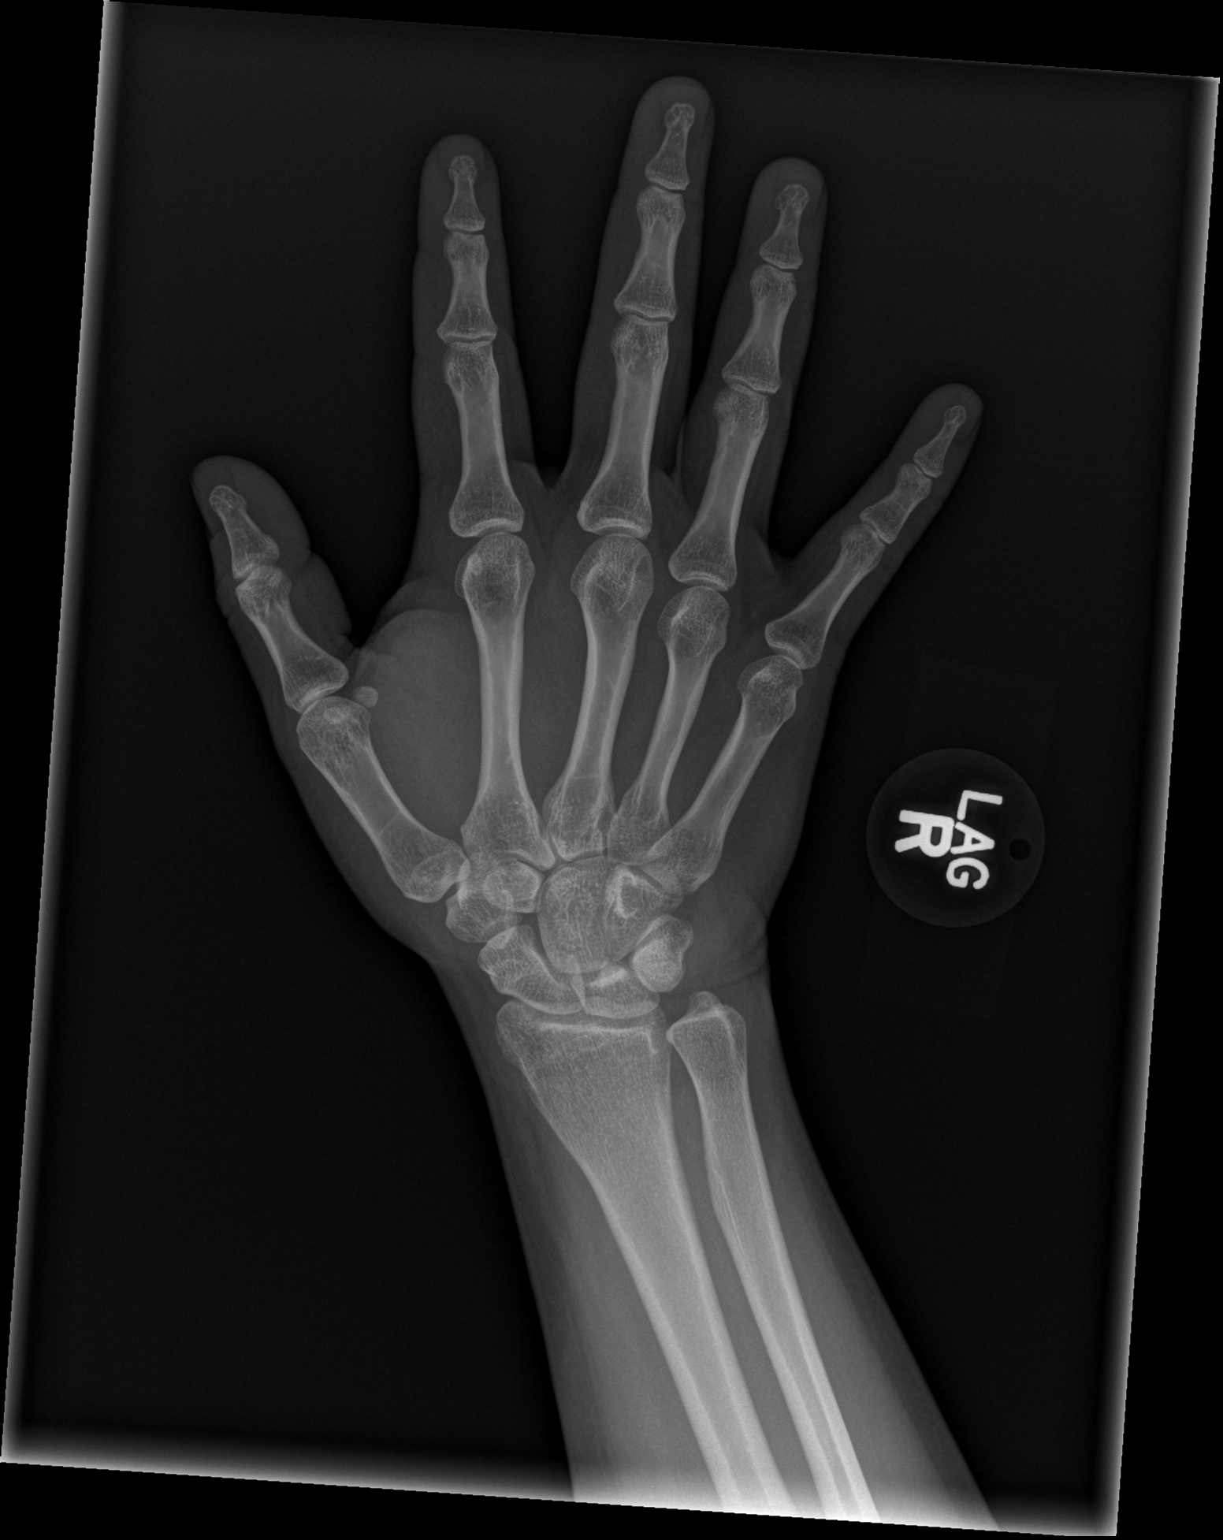

[x hand obl right]
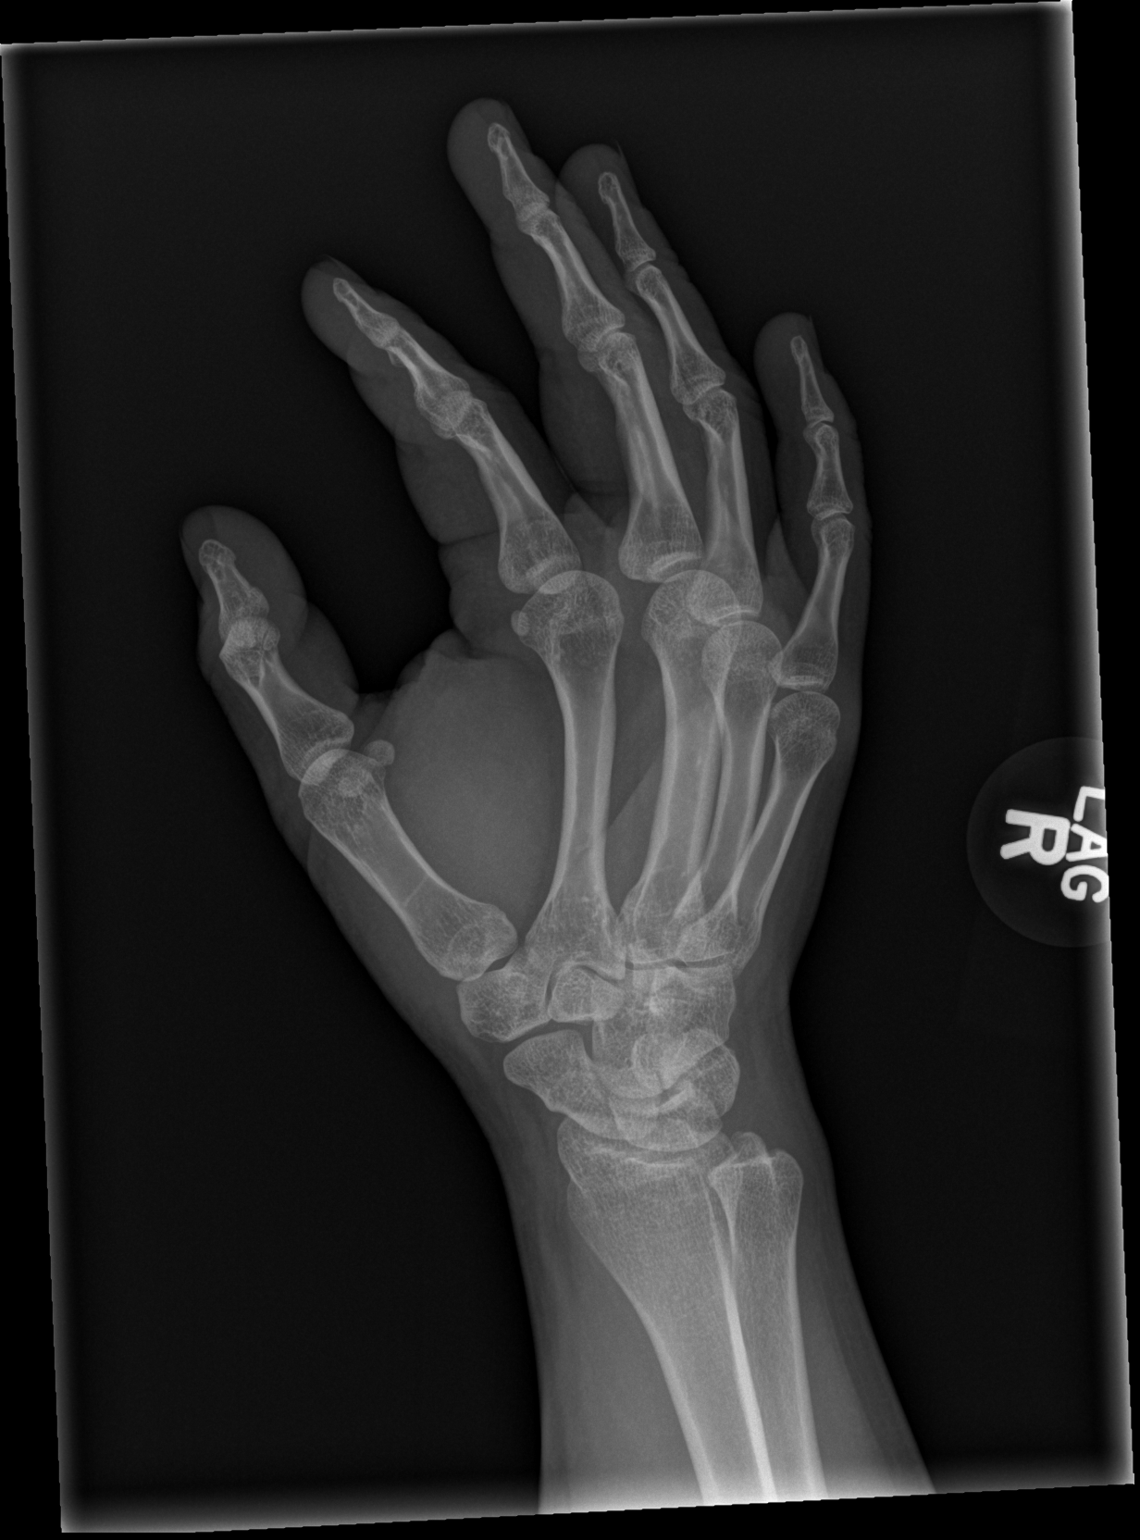

[x hand lat right]
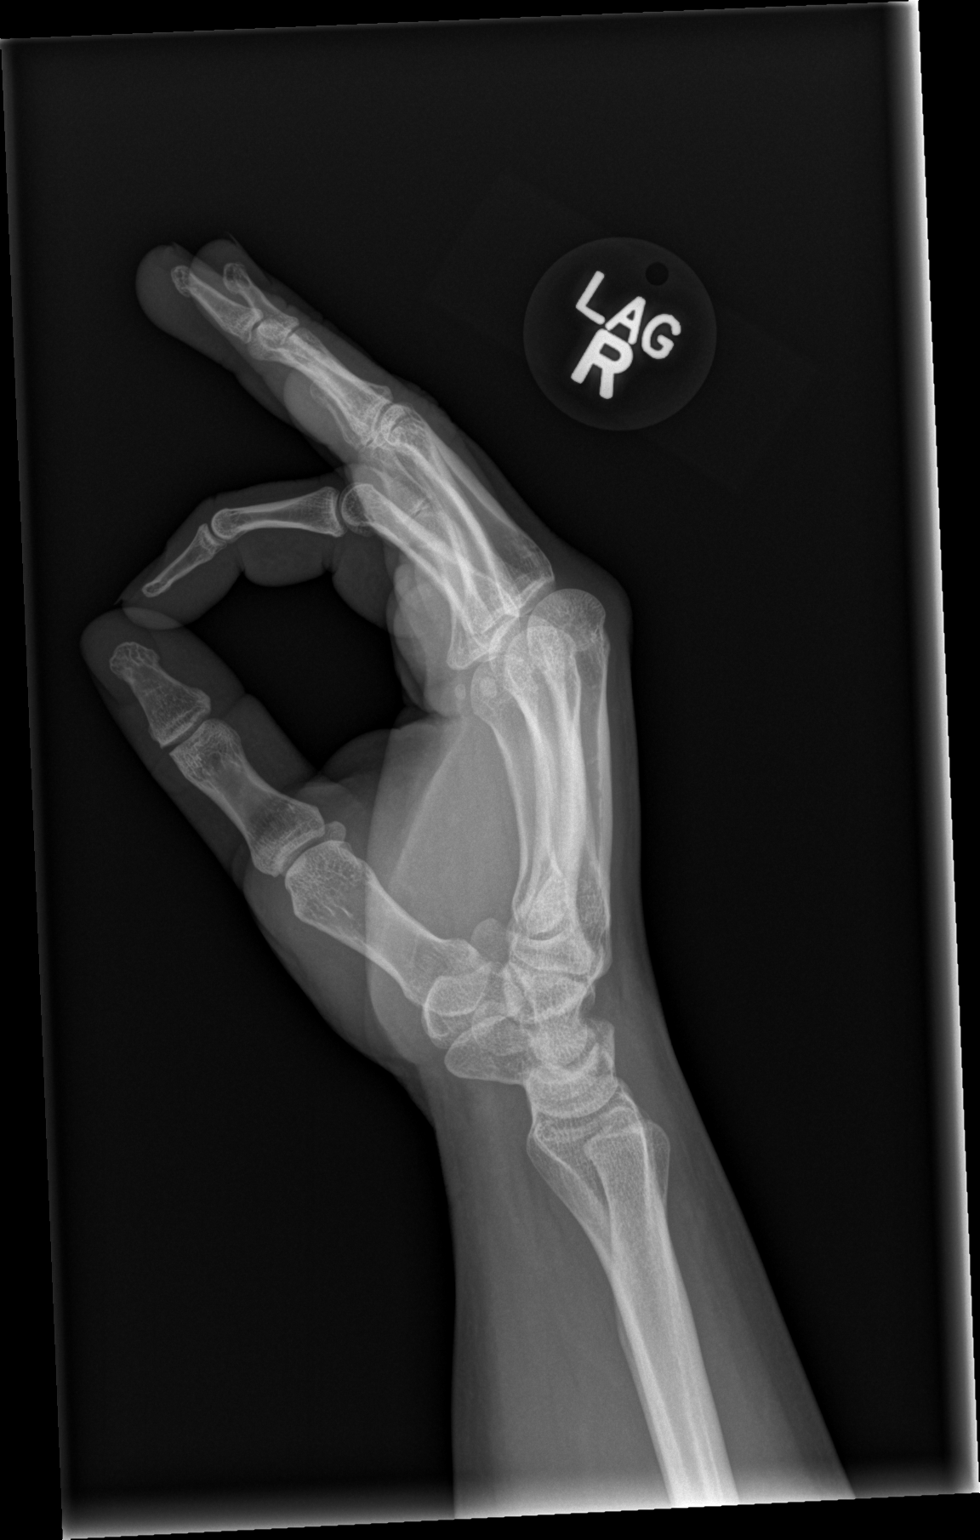

[3 of 3 positions shown; findings below may reference images not displayed]

FINDINGS: There is no evidence of fracture or dislocation. There is no
evidence of arthropathy or other focal bone abnormality. Soft
tissues are unremarkable.
IMPRESSION: Negative.

## 2024-01-08 ENCOUNTER — Other Ambulatory Visit (HOSPITAL_COMMUNITY): Payer: Self-pay

## 2024-01-08 MED ORDER — ZEPBOUND 5 MG/0.5ML ~~LOC~~ SOAJ
5.0000 mg | SUBCUTANEOUS | 2 refills | Status: AC
  Filled 2024-01-08: qty 2, 28d supply, fill #0
  Filled 2024-03-03: qty 2, 28d supply, fill #1
  Filled 2024-03-31: qty 2, 28d supply, fill #2

## 2024-01-09 ENCOUNTER — Other Ambulatory Visit (HOSPITAL_COMMUNITY): Payer: Self-pay

## 2024-03-31 ENCOUNTER — Other Ambulatory Visit (HOSPITAL_COMMUNITY): Payer: Self-pay
